# Patient Record
Sex: Male | Born: 1945
Health system: Southern US, Community
[De-identification: ages and names within clinical notes are randomized; demographics above are authoritative.]

## PROBLEM LIST (undated history)

## (undated) DIAGNOSIS — H409 Unspecified glaucoma: Secondary | ICD-10-CM

## (undated) DIAGNOSIS — I639 Cerebral infarction, unspecified: Secondary | ICD-10-CM

## (undated) DIAGNOSIS — R7303 Prediabetes: Secondary | ICD-10-CM

## (undated) HISTORY — PX: EYE SURGERY: SHX253

---

## 1998-03-04 ENCOUNTER — Ambulatory Visit (HOSPITAL_COMMUNITY): Admission: RE | Admit: 1998-03-04 | Discharge: 1998-03-04 | Payer: Self-pay | Admitting: Cardiology

## 2001-03-22 ENCOUNTER — Encounter: Payer: Self-pay | Admitting: Gastroenterology

## 2001-03-22 ENCOUNTER — Ambulatory Visit (HOSPITAL_COMMUNITY): Admission: RE | Admit: 2001-03-22 | Discharge: 2001-03-22 | Payer: Self-pay | Admitting: Gastroenterology

## 2001-03-25 ENCOUNTER — Ambulatory Visit (HOSPITAL_COMMUNITY): Admission: RE | Admit: 2001-03-25 | Discharge: 2001-03-25 | Payer: Self-pay | Admitting: Gastroenterology

## 2001-03-27 ENCOUNTER — Encounter: Payer: Self-pay | Admitting: Gastroenterology

## 2001-03-27 ENCOUNTER — Ambulatory Visit (HOSPITAL_COMMUNITY): Admission: RE | Admit: 2001-03-27 | Discharge: 2001-03-27 | Payer: Self-pay | Admitting: Gastroenterology

## 2009-11-10 ENCOUNTER — Ambulatory Visit: Payer: Self-pay | Admitting: Otolaryngology

## 2010-10-28 ENCOUNTER — Ambulatory Visit: Payer: Self-pay | Admitting: Internal Medicine

## 2010-11-28 ENCOUNTER — Ambulatory Visit: Payer: Self-pay | Admitting: Endocrinology

## 2011-01-24 ENCOUNTER — Ambulatory Visit: Payer: Self-pay | Admitting: Internal Medicine

## 2011-02-06 ENCOUNTER — Ambulatory Visit: Payer: Self-pay | Admitting: Gastroenterology

## 2011-02-08 LAB — PATHOLOGY REPORT

## 2011-08-14 ENCOUNTER — Emergency Department: Payer: Self-pay | Admitting: *Deleted

## 2011-09-21 ENCOUNTER — Ambulatory Visit: Payer: Self-pay | Admitting: Urology

## 2011-09-21 ENCOUNTER — Emergency Department: Payer: Self-pay | Admitting: Unknown Physician Specialty

## 2011-09-21 LAB — URINALYSIS, COMPLETE
Bilirubin,UR: NEGATIVE
Blood: NEGATIVE
Glucose,UR: 150 mg/dL (ref 0–75)
Leukocyte Esterase: NEGATIVE
Nitrite: NEGATIVE
Ph: 8 (ref 4.5–8.0)
Protein: NEGATIVE
RBC,UR: 3 /HPF (ref 0–5)
Specific Gravity: 1.014 (ref 1.003–1.030)
Squamous Epithelial: NONE SEEN
WBC UR: NONE SEEN /HPF (ref 0–5)

## 2011-09-21 LAB — COMPREHENSIVE METABOLIC PANEL
Albumin: 4.5 g/dL (ref 3.4–5.0)
Alkaline Phosphatase: 55 U/L (ref 50–136)
Anion Gap: 15 (ref 7–16)
BUN: 18 mg/dL (ref 7–18)
Bilirubin,Total: 0.5 mg/dL (ref 0.2–1.0)
Calcium, Total: 9.4 mg/dL (ref 8.5–10.1)
Chloride: 102 mmol/L (ref 98–107)
Co2: 20 mmol/L — ABNORMAL LOW (ref 21–32)
Creatinine: 1.32 mg/dL — ABNORMAL HIGH (ref 0.60–1.30)
EGFR (African American): >60
EGFR (Non-African Amer.): 58 — ABNORMAL LOW
Glucose: 143 mg/dL — ABNORMAL HIGH (ref 65–99)
Osmolality: 278 (ref 275–301)
Potassium: 3.6 mmol/L (ref 3.5–5.1)
SGOT(AST): 31 U/L (ref 15–37)
SGPT (ALT): 32 U/L
Sodium: 137 mmol/L (ref 136–145)
Total Protein: 7.9 g/dL (ref 6.4–8.2)

## 2011-09-21 LAB — CBC
HCT: 43 % (ref 40.0–52.0)
HGB: 14.8 g/dL (ref 13.0–18.0)
MCH: 33.9 pg (ref 26.0–34.0)
MCHC: 34.5 g/dL (ref 32.0–36.0)
MCV: 99 fL (ref 80–100)
Platelet: 171 10*3/uL (ref 150–440)
RBC: 4.36 10*6/uL — ABNORMAL LOW (ref 4.40–5.90)
RDW: 12.8 % (ref 11.5–14.5)
WBC: 10.6 10*3/uL (ref 3.8–10.6)

## 2011-09-21 LAB — LIPASE, BLOOD: Lipase: 69 U/L — ABNORMAL LOW (ref 73–393)

## 2013-08-15 ENCOUNTER — Ambulatory Visit: Payer: Self-pay | Admitting: Family Medicine

## 2013-08-15 LAB — URINALYSIS, COMPLETE
Bilirubin,UR: NEGATIVE
Glucose,UR: 250 mg/dL (ref 0–75)
Ketone: NEGATIVE
Leukocyte Esterase: NEGATIVE
Nitrite: NEGATIVE
Ph: 6 (ref 4.5–8.0)
Protein: NEGATIVE
Specific Gravity: 1.02 (ref 1.003–1.030)

## 2013-10-08 ENCOUNTER — Ambulatory Visit: Payer: Self-pay | Admitting: Physician Assistant

## 2013-10-08 LAB — COMPREHENSIVE METABOLIC PANEL
ALK PHOS: 80 U/L
ALT: 36 U/L (ref 12–78)
ANION GAP: 10 (ref 7–16)
Albumin: 4.2 g/dL (ref 3.4–5.0)
BUN: 11 mg/dL (ref 7–18)
Bilirubin,Total: 0.3 mg/dL (ref 0.2–1.0)
CHLORIDE: 101 mmol/L (ref 98–107)
Calcium, Total: 9.5 mg/dL (ref 8.5–10.1)
Co2: 28 mmol/L (ref 21–32)
Creatinine: 0.89 mg/dL (ref 0.60–1.30)
Glucose: 99 mg/dL (ref 65–99)
Osmolality: 277 (ref 275–301)
POTASSIUM: 4.1 mmol/L (ref 3.5–5.1)
SGOT(AST): 22 U/L (ref 15–37)
Sodium: 139 mmol/L (ref 136–145)
Total Protein: 8 g/dL (ref 6.4–8.2)

## 2013-10-08 LAB — CBC WITH DIFFERENTIAL/PLATELET
BASOS ABS: 0.1 10*3/uL (ref 0.0–0.1)
BASOS PCT: 1.2 %
Eosinophil #: 0.2 10*3/uL (ref 0.0–0.7)
Eosinophil %: 3.1 %
HCT: 44 % (ref 40.0–52.0)
HGB: 15.1 g/dL (ref 13.0–18.0)
LYMPHS ABS: 2.7 10*3/uL (ref 1.0–3.6)
LYMPHS PCT: 45.2 %
MCH: 33.2 pg (ref 26.0–34.0)
MCHC: 34.3 g/dL (ref 32.0–36.0)
MCV: 97 fL (ref 80–100)
Monocyte #: 0.5 x10 3/mm (ref 0.2–1.0)
Monocyte %: 8.9 %
NEUTROS ABS: 2.5 10*3/uL (ref 1.4–6.5)
NEUTROS PCT: 41.6 %
Platelet: 194 10*3/uL (ref 150–440)
RBC: 4.54 10*6/uL (ref 4.40–5.90)
RDW: 12.9 % (ref 11.5–14.5)
WBC: 6 10*3/uL (ref 3.8–10.6)

## 2013-10-08 LAB — LIPASE, BLOOD: LIPASE: 112 U/L (ref 73–393)

## 2013-10-08 LAB — AMYLASE: Amylase: 42 U/L (ref 25–115)

## 2013-10-15 ENCOUNTER — Encounter: Payer: Self-pay | Admitting: Physician Assistant

## 2013-11-02 ENCOUNTER — Encounter: Payer: Self-pay | Admitting: Physician Assistant

## 2013-12-03 ENCOUNTER — Encounter: Payer: Self-pay | Admitting: Physician Assistant

## 2015-11-15 ENCOUNTER — Ambulatory Visit
Admission: EM | Admit: 2015-11-15 | Discharge: 2015-11-15 | Disposition: A | Discharge status: Home / Self Care | Payer: PPO | Attending: Family Medicine | Admitting: Family Medicine

## 2015-11-15 DIAGNOSIS — J069 Acute upper respiratory infection, unspecified: Secondary | ICD-10-CM

## 2015-11-15 HISTORY — DX: Unspecified glaucoma: H40.9

## 2015-11-15 MED ORDER — GUAIFENESIN-CODEINE 100-10 MG/5ML PO SOLN: 5.0000 mL | Freq: Every evening | ORAL | Status: DC | PRN

## 2015-11-15 MED ORDER — BENZONATATE 100 MG PO CAPS: 100.0000 mg | ORAL_CAPSULE | Freq: Three times a day (TID) | ORAL | Status: DC | PRN

## 2015-11-15 NOTE — ED Notes (Signed)
C/o post nasal drip x 2-3 days. States has dry hacky cough.

## 2015-11-15 NOTE — ED Provider Notes (Signed)
Daniel Manning  ____________________________________________  Time seen: Approximately 5:52 PM  I have reviewed the triage vital signs and the nursing notes.   HISTORY  Chief Complaint URI and Sore Throat   HPI Daniel Manning is a 70 y.o. male presents with complaints of 2-3 days of runny nose, nasal congestion, watery eyes, sneezing and dry cough. Patient reports that the dry cough does occasionally wake him up from sleep. Denies fevers. Reports continues to eat and drink well. States overall is feeling well. Reports has taken some over-the-counter cough and congestion medications without symptom resolution.  Denies known sick contacts. Denies chest pain, shortness of breath, dizziness, weakness, vision changes, neck pain or back pain.  PCP: Daniel Manning    Past Medical History  Diagnosis Date  . Glaucoma     There are no active problems to display for this patient.   History reviewed. No pertinent past surgical history.  Current Outpatient Rx  Name  Route  Sig  Dispense  Refill  . valACYclovir (VALTREX) 500 MG tablet   Oral   Take 500 mg by mouth daily.         .           .             Allergies Review of patient's allergies indicates no known allergies.  Family History  Problem Relation Age of Onset  . Cancer Mother   . Heart attack Father     Social History Social History  Substance Use Topics  . Smoking status: Former Research scientist (life sciences)  . Smokeless tobacco: None  . Alcohol Use: No    Review of Systems Constitutional: No fever/chills Eyes: No visual changes. ENT: States occasional sore throat. Positive runny nose, nasal congestion, sneezing. Cardiovascular: Denies chest pain. Respiratory: Denies shortness of breath. Gastrointestinal: No abdominal pain.  No nausea, no vomiting.  No diarrhea.  No constipation. Genitourinary: Negative for dysuria. Musculoskeletal: Negative for back pain. Skin: Negative for rash. Neurological: Negative for headaches,  focal weakness or numbness.  10-point ROS otherwise negative.  ____________________________________________   PHYSICAL EXAM:  VITAL SIGNS: ED Triage Vitals  Enc Vitals Group     BP 11/15/15 1608 141/81 mmHg     Pulse Rate 11/15/15 1608 74     Resp 11/15/15 1608 16     Temp 11/15/15 1608 98.2 F (36.8 C)     Temp Source 11/15/15 1608 Tympanic     SpO2 11/15/15 1608 98 %     Weight 11/15/15 1608 186 lb (84.369 kg)     Height 11/15/15 1608 5\' 7"  (1.702 m)     Head Cir --      Peak Flow --      Pain Score --      Pain Loc --      Pain Edu? --      Excl. in [place]? --   Constitutional: Alert and oriented. Well appearing and in no acute distress. Eyes: Conjunctivae are normal. PERRL. EOMI. Head: Atraumatic. No sinus tenderness to palpation. No swelling. No erythema.  Ears: no erythema, normal TMs bilaterally.   Nose:Nasal congestion with clear rhinorrhea  Mouth/Throat: Mucous membranes are moist. No pharyngeal erythema. No tonsillar swelling or exudate.  Neck: No stridor.  No cervical spine tenderness to palpation. Hematological/Lymphatic/Immunilogical: No cervical lymphadenopathy. Cardiovascular: Normal rate, regular rhythm. Grossly normal heart sounds.  Good peripheral circulation. Respiratory: Normal respiratory effort.  No retractions. Lungs CTAB.No wheezes, rales or rhonchi. Good air movement.  Gastrointestinal: Soft and  nontender. Normal Bowel sounds. No CVA tenderness. Musculoskeletal: No lower or upper extremity tenderness nor edema. No cervical, thoracic or lumbar tenderness to palpation. Neurologic:  Normal speech and language. No gross focal neurologic deficits are appreciated. No gait instability. Skin:  Skin is warm, dry and intact. No rash noted. Psychiatric: Mood and affect are normal. Speech and behavior are normal.  ____________________________________________   LABS (all labs ordered are listed, but only abnormal results are displayed)  Labs Reviewed - No data  to display  INITIAL IMPRESSION / ASSESSMENT AND PLAN / ED COURSE  Pertinent labs & imaging results that were available during my Manning of the patient were reviewed by me and considered in my medical decision making (see chart for details).  Very well-appearing patient. No acute distress. Presents for the complaints of 2-3 days of runny nose, nasal congestion, sneezing, watery eyes. Denies fevers. Reports continues to eat and drink well. States he does not think it is strep throat does not want to be stopped for strep throat. Suspect viral upper respiratory infection. Discussed will treat supportively and symptomatically. Will treat with when necessary Tessalon Perles during the day as well as when necessary guaifenesin with Mucinex at night. Patient reports that he is taking the guaifenesin with Mucinex at night and tolerated well in past.Discussed indication, risks and benefits of medications with patient. Encouraged rest, fluids and PCP follow up.  Discussed follow up with Primary Manning physician this week. Discussed follow up and return parameters including no resolution or any worsening concerns. Patient verbalized understanding and agreed to plan.   ____________________________________________   FINAL CLINICAL IMPRESSION(S) / ED DIAGNOSES  Final diagnoses:  Upper respiratory infection      Note: This dictation was prepared with Dragon dictation along with smaller phrase technology. Any transcriptional errors that result from this process are unintentional.    Daniel Land, NP 11/15/15 1757

## 2015-11-15 NOTE — Discharge Instructions (Signed)
Take medication as prescribed. Rest. Drink plenty of fluids.   Follow up with your primary care physician this week as needed. Return to Urgent care for new or worsening concerns.    Upper Respiratory Infection, Adult Most upper respiratory infections (URIs) are a viral infection of the air passages leading to the lungs. A URI affects the nose, throat, and upper air passages. The most common type of URI is nasopharyngitis and is typically referred to as "the common cold." URIs run their course and usually go away on their own. Most of the time, a URI does not require medical attention, but sometimes a bacterial infection in the upper airways can follow a viral infection. This is called a secondary infection. Sinus and middle ear infections are common types of secondary upper respiratory infections. Bacterial pneumonia can also complicate a URI. A URI can worsen asthma and chronic obstructive pulmonary disease (COPD). Sometimes, these complications can require emergency medical care and may be life threatening.  CAUSES Almost all URIs are caused by viruses. A virus is a type of germ and can spread from one person to another.  RISKS FACTORS You may be at risk for a URI if:   You smoke.   You have chronic heart or lung disease.  You have a weakened defense (immune) system.   You are very young or very old.   You have nasal allergies or asthma.  You work in crowded or poorly ventilated areas.  You work in health care facilities or schools. SIGNS AND SYMPTOMS  Symptoms typically develop 2-3 days after you come in contact with a cold virus. Most viral URIs last 7-10 days. However, viral URIs from the influenza virus (flu virus) can last 14-18 days and are typically more severe. Symptoms may include:   Runny or stuffy (congested) nose.   Sneezing.   Cough.   Sore throat.   Headache.   Fatigue.   Fever.   Loss of appetite.   Pain in your forehead, behind your eyes,  and over your cheekbones (sinus pain).  Muscle aches.  DIAGNOSIS  Your health care provider may diagnose a URI by:  Physical exam.  Tests to check that your symptoms are not due to another condition such as:  Strep throat.  Sinusitis.  Pneumonia.  Asthma. TREATMENT  A URI goes away on its own with time. It cannot be cured with medicines, but medicines may be prescribed or recommended to relieve symptoms. Medicines may help:  Reduce your fever.  Reduce your cough.  Relieve nasal congestion. HOME CARE INSTRUCTIONS   Take medicines only as directed by your health care provider.   Gargle warm saltwater or take cough drops to comfort your throat as directed by your health care provider.  Use a warm mist humidifier or inhale steam from a shower to increase air moisture. This may make it easier to breathe.  Drink enough fluid to keep your urine clear or pale yellow.   Eat soups and other clear broths and maintain good nutrition.   Rest as needed.   Return to work when your temperature has returned to normal or as your health care provider advises. You may need to stay home longer to avoid infecting others. You can also use a face mask and careful hand washing to prevent spread of the virus.  Increase the usage of your inhaler if you have asthma.   Do not use any tobacco products, including cigarettes, chewing tobacco, or electronic cigarettes. If you need help  quitting, ask your health care provider. PREVENTION  The best way to protect yourself from getting a cold is to practice good hygiene.   Avoid oral or hand contact with people with cold symptoms.   Wash your hands often if contact occurs.  There is no clear evidence that vitamin C, vitamin E, echinacea, or exercise reduces the chance of developing a cold. However, it is always recommended to get plenty of rest, exercise, and practice good nutrition.  SEEK MEDICAL CARE IF:   You are getting worse rather than  better.   Your symptoms are not controlled by medicine.   You have chills.  You have worsening shortness of breath.  You have brown or red mucus.  You have yellow or brown nasal discharge.  You have pain in your face, especially when you bend forward.  You have a fever.  You have swollen neck glands.  You have pain while swallowing.  You have white areas in the back of your throat. SEEK IMMEDIATE MEDICAL CARE IF:   You have severe or persistent:  Headache.  Ear pain.  Sinus pain.  Chest pain.  You have chronic lung disease and any of the following:  Wheezing.  Prolonged cough.  Coughing up blood.  A change in your usual mucus.  You have a stiff neck.  You have changes in your:  Vision.  Hearing.  Thinking.  Mood. MAKE SURE YOU:   Understand these instructions.  Will watch your condition.  Will get help right away if you are not doing well or get worse.   This information is not intended to replace advice given to you by your health care provider. Make sure you discuss any questions you have with your health care provider.   Document Released: 02/14/2001 Document Revised: 01/05/2015 Document Reviewed: 11/26/2013 Elsevier Interactive Patient Education [employer].

## 2015-12-13 ENCOUNTER — Ambulatory Visit
Admission: EM | Admit: 2015-12-13 | Discharge: 2015-12-13 | Disposition: A | Discharge status: Home / Self Care | Payer: PPO | Attending: Family Medicine | Admitting: Family Medicine

## 2015-12-13 ENCOUNTER — Encounter: Payer: Self-pay | Admitting: Emergency Medicine

## 2015-12-13 DIAGNOSIS — J101 Influenza due to other identified influenza virus with other respiratory manifestations: Secondary | ICD-10-CM | POA: Diagnosis not present

## 2015-12-13 LAB — RAPID INFLUENZA A&B ANTIGENS (ARMC ONLY): INFLUENZA A (ARMC): NEGATIVE

## 2015-12-13 LAB — RAPID INFLUENZA A&B ANTIGENS: Influenza B (ARMC): POSITIVE — AB

## 2015-12-13 MED ORDER — HYDROCOD POLST-CPM POLST ER 10-8 MG/5ML PO SUER: 5.0000 mL | Freq: Two times a day (BID) | ORAL | Status: DC | PRN

## 2015-12-13 MED ORDER — OSELTAMIVIR PHOSPHATE 75 MG PO CAPS: 75.0000 mg | ORAL_CAPSULE | Freq: Two times a day (BID) | ORAL | Status: DC

## 2015-12-13 NOTE — Discharge Instructions (Signed)
Influenza, Adult Influenza (flu) is an infection in the mouth, nose, and throat (respiratory tract) caused by a virus. The flu can make you feel very ill. Influenza spreads easily from person to person (contagious).  HOME CARE   Only take medicines as told by your doctor.  Use a cool mist humidifier to make breathing easier.  Get plenty of rest until your fever goes away. This usually takes 3 to 4 days.  Drink enough fluids to keep your pee (urine) clear or pale yellow.  Cover your mouth and nose when you cough or sneeze.  Wash your hands well to avoid spreading the flu.  Stay home from work or school until your fever has been gone for at least 1 full day.  Get a flu shot every year. GET HELP RIGHT AWAY IF:   You have trouble breathing or feel short of breath.  Your skin or nails turn blue.  You have severe neck pain or stiffness.  You have a severe headache, facial pain, or earache.  Your fever gets worse or keeps coming back.  You feel sick to your stomach (nauseous), throw up (vomit), or have watery poop (diarrhea).  You have chest pain.  You have a deep cough that gets worse, or you cough up more thick spit (mucus). MAKE SURE YOU:   Understand these instructions.  Will watch your condition.  Will get help right away if you are not doing well or get worse.   This information is not intended to replace advice given to you by your health care provider. Make sure you discuss any questions you have with your health care provider.   Document Released: 05/30/2008 Document Revised: 09/11/2014 Document Reviewed: 11/20/2011 Elsevier Interactive Patient Education 2016 Else[employer]  Upper Respiratory Infection, Adult Most upper respiratory infections (URIs) are caused by a virus. A URI affects the nose, throat, and upper air passages. The most common type of URI is often called "the common cold." HOME CARE   Take medicines only as told by your doctor.  Gargle warm  saltwater or take cough drops to comfort your throat as told by your doctor.  Use a warm mist humidifier or inhale steam from a shower to increase air moisture. This may make it easier to breathe.  Drink enough fluid to keep your pee (urine) clear or pale yellow.  Eat soups and other clear broths.  Have a healthy diet.  Rest as needed.  Go back to work when your fever is gone or your doctor says it is okay.  You may need to stay home longer to avoid giving your URI to others.  You can also wear a face mask and wash your hands often to prevent spread of the virus.  Use your inhaler more if you have asthma.  Do not use any tobacco products, including cigarettes, chewing tobacco, or electronic cigarettes. If you need help quitting, ask your doctor. GET HELP IF:  You are getting worse, not better.  Your symptoms are not helped by medicine.  You have chills.  You are getting more short of breath.  You have brown or red mucus.  You have yellow or brown discharge from your nose.  You have pain in your face, especially when you bend forward.  You have a fever.  You have puffy (swollen) neck glands.  You have pain while swallowing.  You have white areas in the back of your throat. GET HELP RIGHT AWAY IF:   You have very bad  or constant:  Headache.  Ear pain.  Pain in your forehead, behind your eyes, and over your cheekbones (sinus pain).  Chest pain.  You have long-lasting (chronic) lung disease and any of the following:  Wheezing.  Long-lasting cough.  Coughing up blood.  A change in your usual mucus.  You have a stiff neck.  You have changes in your:  Vision.  Hearing.  Thinking.  Mood. MAKE SURE YOU:   Understand these instructions.  Will watch your condition.  Will get help right away if you are not doing well or get worse.   This information is not intended to replace advice given to you by your health care provider. Make sure you  discuss any questions you have with your health care provider.   Document Released: 02/07/2008 Document Revised: 01/05/2015 Document Reviewed: 11/26/2013 Elsevier Interactive Patient Education [employer].

## 2015-12-13 NOTE — ED Notes (Signed)
Patient c/o cough, chest congestion, runny nose, and fever for the past 2 days.

## 2015-12-13 NOTE — ED Provider Notes (Signed)
CSN: [id number]     Arrival date & time 12/13/15  1628 History   First MD Initiated Contact with Patient 12/13/15 1725    Nurses notes were reviewed.  Chief Complaint  Patient presents with  . Cough  . Nasal Congestion   Patient reports being still initially sick or not there will Thursday they really hit him Friday acid daily he really start aching all over nasal congestion and coughing.. So for about some 2 hours he's felt miserable. He was sick about a month ago coughed up some green sputum states that the get better this from his sputum is not as bad but he's been aching all over well.  He has history of glaucoma multiple eye disease chronic corneal transplant as well. His mother has had cancer his father's had heart attack. He is a former smoker. He does not get his flu shot because he states it made him sick last with a making this 73 says yes.  (Consider location/radiation/quality/duration/timing/severity/associated sxs/prior Treatment) Patient is a 70 y.o. male presenting with cough. The history is provided by the patient.  Cough Cough characteristics:  Non-productive and hacking Severity:  Moderate Duration:  4 days Timing:  Constant Progression:  Waxing and waning Context: smoke exposure and upper respiratory infection   Relieved by:  Nothing Worsened by:  Nothing tried Associated symptoms: chills, fever, headaches, myalgias, rhinorrhea, sinus congestion and sore throat     Past Medical History  Diagnosis Date  . Glaucoma    History reviewed. No pertinent past surgical history. Family History  Problem Relation Age of Onset  . Cancer Mother   . Heart attack Father    Social History  Substance Use Topics  . Smoking status: Former Research scientist (life sciences)  . Smokeless tobacco: None  . Alcohol Use: No    Review of Systems  Constitutional: Positive for fever and chills.  HENT: Positive for rhinorrhea and sore throat.   Respiratory: Positive for cough.   Musculoskeletal: Positive for  myalgias.  Neurological: Positive for headaches.  All other systems reviewed and are negative.   Allergies  Combigan; Lumigan; Moxeza; Pravastatin; and [place]yman Medications   Prior to Admission medications   Medication Sig Start Date End Date Taking? Authorizing Provider  ALPRAZolam Duanne Moron) 0.5 MG tablet Take 0.5 mg by mouth at bedtime as needed for anxiety.   Yes Historical Provider, MD  brimonidine (ALPHAGAN) 0.2 % ophthalmic solution Place 1 drop into the left eye 2 (two) times daily.   Yes Historical Provider, MD  Carboxymeth-Glycerin-Polysorb 0.5-1-0.5 % SOLN Apply 1 drop to eye as needed.   Yes Historical Provider, MD  Multiple Vitamin (MULTIVITAMIN) tablet Take 1 tablet by mouth daily.   Yes Historical Provider, MD  pantoprazole (PROTONIX) 40 MG tablet Take 40 mg by mouth 2 (two) times daily.   Yes Historical Provider, MD  tamsulosin (FLOMAX) 0.4 MG CAPS capsule Take 0.4 mg by mouth daily.   Yes Historical Provider, MD  timolol (BETIMOL) 0.5 % ophthalmic solution Place 1 drop into the left eye 2 (two) times daily.   Yes Historical Provider, MD  traMADol (ULTRAM) 50 MG tablet Take 50 mg by mouth every 6 (six) hours as needed.   Yes Historical Provider, MD  benzonatate (TESSALON PERLES) 100 MG capsule Take 1 capsule (100 mg total) by mouth 3 (three) times daily as needed for cough. 11/15/15   [name], NP  chlorpheniramine-HYDROcodone [facility] PENNKINETIC ER) 10-8 MG/5ML SUER Take 5 mLs by mouth every 12 (twelve) hours as  needed for cough. 12/13/15   Frederich Cha, MD  guaiFENesin-codeine 100-10 MG/5ML syrup Take 5 mLs by mouth at bedtime as needed for cough (do not drive or operate machinery while taking as can cause drowsiness). 11/15/15   [name], NP  oseltamivir (TAMIFLU) 75 MG capsule Take 1 capsule (75 mg total) by mouth 2 (two) times daily. 12/13/15   Frederich Cha, MD  valACYclovir (VALTREX) 500 MG tablet Take 500 mg by mouth 2 (two) times daily.     Historical  Provider, MD   Meds Ordered and Administered this Visit  Medications - No data to display  BP 126/73 mmHg  Pulse 99  Temp(Src) 98.3 F (36.8 C) (Tympanic)  Resp 16  Ht 5\' 7"  (1.702 m)  Wt 186 lb (84.369 kg)  BMI 29.12 kg/m2  SpO2 95% No data found.   Physical Exam  Constitutional: He appears well-developed and well-nourished.  HENT:  Head: Normocephalic.  Eyes: Pupils are equal, round, and reactive to light.  Neck: Normal range of motion.  Cardiovascular: Normal rate and regular rhythm.   Pulmonary/Chest: Effort normal and breath sounds normal.  Musculoskeletal: Normal range of motion.  Neurological: He is alert.  Skin: Skin is warm and dry.  Vitals reviewed.   ED Course  Procedures (including critical care time)  Labs Review Labs Reviewed  RAPID INFLUENZA A&B ANTIGENS ([place]) - Abnormal; Notable for the following:    Influenza B ([facility]) POSITIVE (*)    All other components within normal limits    Imaging Review No results found.   Visual Acuity Review  Right Eye Distance:   Left Eye Distance:   Bilateral Distance:    Right Eye Near:   Left Eye Near:    Bilateral Near:      Results for orders placed or performed during the hospital encounter of 12/13/15  Rapid Influenza A&B Antigens ([facility] only)  Result Value Ref Range   Influenza A ([facility]) NEGATIVE NEGATIVE   Influenza B ([facility]) POSITIVE (A) NEGATIVE     MDM   1. Influenza B    We'll place patient on Tamiflu and Tussionex 1 teaspoon twice a day. Spray to him that the Tamiflu even better if he started it on Saturday or Sunday but because he still having symptoms and his history of having multiple medical problems we'll go ahead and treat him with the Tamiflu today. For the cough or placement test next 1 teaspoon twice a day. Since he has a history of multiple eye issues and cannot tolerate things advises I'll will hold off her antihistamine decongestants and also on anti-inflammatories. Follow-up  with his PCP in a week if not better.   Note: This dictation was prepared with Dragon dictation along with smaller phrase technology. Any transcriptional errors that result from this process are unintentional.    Frederich Cha, MD 12/13/15 [phone]

## 2015-12-23 DIAGNOSIS — Z947 Corneal transplant status: Secondary | ICD-10-CM | POA: Diagnosis not present

## 2016-01-17 DIAGNOSIS — M545 Low back pain: Secondary | ICD-10-CM | POA: Diagnosis not present

## 2016-01-17 DIAGNOSIS — G8929 Other chronic pain: Secondary | ICD-10-CM | POA: Diagnosis not present

## 2016-01-17 DIAGNOSIS — M79601 Pain in right arm: Secondary | ICD-10-CM | POA: Diagnosis not present

## 2016-01-17 DIAGNOSIS — M79621 Pain in right upper arm: Secondary | ICD-10-CM | POA: Diagnosis not present

## 2016-02-14 DIAGNOSIS — H209 Unspecified iridocyclitis: Secondary | ICD-10-CM | POA: Diagnosis not present

## 2016-02-14 DIAGNOSIS — H4042X1 Glaucoma secondary to eye inflammation, left eye, mild stage: Secondary | ICD-10-CM | POA: Diagnosis not present

## 2016-02-28 DIAGNOSIS — K219 Gastro-esophageal reflux disease without esophagitis: Secondary | ICD-10-CM | POA: Diagnosis not present

## 2016-02-28 DIAGNOSIS — G8929 Other chronic pain: Secondary | ICD-10-CM | POA: Diagnosis not present

## 2016-02-28 DIAGNOSIS — E119 Type 2 diabetes mellitus without complications: Secondary | ICD-10-CM | POA: Diagnosis not present

## 2016-02-28 DIAGNOSIS — E7800 Pure hypercholesterolemia, unspecified: Secondary | ICD-10-CM | POA: Diagnosis not present

## 2016-02-28 DIAGNOSIS — M545 Low back pain: Secondary | ICD-10-CM | POA: Diagnosis not present

## 2016-02-28 DIAGNOSIS — N401 Enlarged prostate with lower urinary tract symptoms: Secondary | ICD-10-CM | POA: Diagnosis not present

## 2016-05-15 DIAGNOSIS — H8110 Benign paroxysmal vertigo, unspecified ear: Secondary | ICD-10-CM | POA: Diagnosis not present

## 2016-09-11 DIAGNOSIS — E119 Type 2 diabetes mellitus without complications: Secondary | ICD-10-CM | POA: Diagnosis not present

## 2016-09-11 DIAGNOSIS — M545 Low back pain: Secondary | ICD-10-CM | POA: Diagnosis not present

## 2016-09-11 DIAGNOSIS — Z79891 Long term (current) use of opiate analgesic: Secondary | ICD-10-CM | POA: Diagnosis not present

## 2016-09-11 DIAGNOSIS — E7800 Pure hypercholesterolemia, unspecified: Secondary | ICD-10-CM | POA: Diagnosis not present

## 2016-09-11 DIAGNOSIS — G8929 Other chronic pain: Secondary | ICD-10-CM | POA: Diagnosis not present

## 2016-09-11 DIAGNOSIS — K219 Gastro-esophageal reflux disease without esophagitis: Secondary | ICD-10-CM | POA: Diagnosis not present

## 2016-09-11 DIAGNOSIS — N401 Enlarged prostate with lower urinary tract symptoms: Secondary | ICD-10-CM | POA: Diagnosis not present

## 2016-10-16 DIAGNOSIS — H209 Unspecified iridocyclitis: Secondary | ICD-10-CM | POA: Diagnosis not present

## 2016-10-16 DIAGNOSIS — H4042X1 Glaucoma secondary to eye inflammation, left eye, mild stage: Secondary | ICD-10-CM | POA: Diagnosis not present

## 2016-10-16 DIAGNOSIS — Z947 Corneal transplant status: Secondary | ICD-10-CM | POA: Diagnosis not present

## 2016-11-02 DIAGNOSIS — H2511 Age-related nuclear cataract, right eye: Secondary | ICD-10-CM | POA: Diagnosis not present

## 2016-11-02 DIAGNOSIS — Z947 Corneal transplant status: Secondary | ICD-10-CM | POA: Diagnosis not present

## 2017-02-12 DIAGNOSIS — N401 Enlarged prostate with lower urinary tract symptoms: Secondary | ICD-10-CM | POA: Diagnosis not present

## 2017-02-12 DIAGNOSIS — E7800 Pure hypercholesterolemia, unspecified: Secondary | ICD-10-CM | POA: Diagnosis not present

## 2017-02-12 DIAGNOSIS — Z125 Encounter for screening for malignant neoplasm of prostate: Secondary | ICD-10-CM | POA: Diagnosis not present

## 2017-02-12 DIAGNOSIS — E119 Type 2 diabetes mellitus without complications: Secondary | ICD-10-CM | POA: Diagnosis not present

## 2017-02-12 DIAGNOSIS — K219 Gastro-esophageal reflux disease without esophagitis: Secondary | ICD-10-CM | POA: Diagnosis not present

## 2017-02-12 DIAGNOSIS — M545 Low back pain: Secondary | ICD-10-CM | POA: Diagnosis not present

## 2017-02-12 DIAGNOSIS — G8929 Other chronic pain: Secondary | ICD-10-CM | POA: Diagnosis not present

## 2017-02-19 DIAGNOSIS — Z125 Encounter for screening for malignant neoplasm of prostate: Secondary | ICD-10-CM | POA: Diagnosis not present

## 2017-02-19 DIAGNOSIS — E7800 Pure hypercholesterolemia, unspecified: Secondary | ICD-10-CM | POA: Diagnosis not present

## 2017-02-19 DIAGNOSIS — E119 Type 2 diabetes mellitus without complications: Secondary | ICD-10-CM | POA: Diagnosis not present

## 2017-03-12 DIAGNOSIS — H4010X1 Unspecified open-angle glaucoma, mild stage: Secondary | ICD-10-CM | POA: Diagnosis not present

## 2017-04-23 DIAGNOSIS — H209 Unspecified iridocyclitis: Secondary | ICD-10-CM | POA: Diagnosis not present

## 2017-04-23 DIAGNOSIS — H4042X1 Glaucoma secondary to eye inflammation, left eye, mild stage: Secondary | ICD-10-CM | POA: Diagnosis not present

## 2017-04-30 DIAGNOSIS — R0683 Snoring: Secondary | ICD-10-CM | POA: Diagnosis not present

## 2017-05-28 DIAGNOSIS — G4733 Obstructive sleep apnea (adult) (pediatric): Secondary | ICD-10-CM | POA: Diagnosis not present

## 2017-07-12 DIAGNOSIS — G4733 Obstructive sleep apnea (adult) (pediatric): Secondary | ICD-10-CM | POA: Diagnosis not present

## 2017-08-11 DIAGNOSIS — G4733 Obstructive sleep apnea (adult) (pediatric): Secondary | ICD-10-CM | POA: Diagnosis not present

## 2017-08-17 DIAGNOSIS — E669 Obesity, unspecified: Secondary | ICD-10-CM | POA: Diagnosis not present

## 2017-08-17 DIAGNOSIS — R7303 Prediabetes: Secondary | ICD-10-CM | POA: Diagnosis not present

## 2017-08-17 DIAGNOSIS — G4733 Obstructive sleep apnea (adult) (pediatric): Secondary | ICD-10-CM | POA: Diagnosis not present

## 2017-08-17 DIAGNOSIS — K219 Gastro-esophageal reflux disease without esophagitis: Secondary | ICD-10-CM | POA: Diagnosis not present

## 2017-08-17 DIAGNOSIS — H409 Unspecified glaucoma: Secondary | ICD-10-CM | POA: Diagnosis not present

## 2017-08-20 DIAGNOSIS — N401 Enlarged prostate with lower urinary tract symptoms: Secondary | ICD-10-CM | POA: Diagnosis not present

## 2017-08-20 DIAGNOSIS — K219 Gastro-esophageal reflux disease without esophagitis: Secondary | ICD-10-CM | POA: Diagnosis not present

## 2017-08-20 DIAGNOSIS — Z23 Encounter for immunization: Secondary | ICD-10-CM | POA: Diagnosis not present

## 2017-08-20 DIAGNOSIS — M545 Low back pain: Secondary | ICD-10-CM | POA: Diagnosis not present

## 2017-08-20 DIAGNOSIS — G8929 Other chronic pain: Secondary | ICD-10-CM | POA: Diagnosis not present

## 2017-08-20 DIAGNOSIS — E119 Type 2 diabetes mellitus without complications: Secondary | ICD-10-CM | POA: Diagnosis not present

## 2017-08-20 DIAGNOSIS — E7800 Pure hypercholesterolemia, unspecified: Secondary | ICD-10-CM | POA: Diagnosis not present

## 2017-08-20 DIAGNOSIS — Z0000 Encounter for general adult medical examination without abnormal findings: Secondary | ICD-10-CM | POA: Diagnosis not present

## 2017-08-20 DIAGNOSIS — G4733 Obstructive sleep apnea (adult) (pediatric): Secondary | ICD-10-CM | POA: Diagnosis not present

## 2017-09-10 DIAGNOSIS — Z947 Corneal transplant status: Secondary | ICD-10-CM | POA: Diagnosis not present

## 2017-09-11 DIAGNOSIS — G4733 Obstructive sleep apnea (adult) (pediatric): Secondary | ICD-10-CM | POA: Diagnosis not present

## 2017-09-17 DIAGNOSIS — H4042X1 Glaucoma secondary to eye inflammation, left eye, mild stage: Secondary | ICD-10-CM | POA: Diagnosis not present

## 2017-09-17 DIAGNOSIS — H209 Unspecified iridocyclitis: Secondary | ICD-10-CM | POA: Diagnosis not present

## 2017-09-17 DIAGNOSIS — Z947 Corneal transplant status: Secondary | ICD-10-CM | POA: Diagnosis not present

## 2017-09-17 DIAGNOSIS — B0052 Herpesviral keratitis: Secondary | ICD-10-CM | POA: Diagnosis not present

## 2017-09-18 DIAGNOSIS — H4042X1 Glaucoma secondary to eye inflammation, left eye, mild stage: Secondary | ICD-10-CM | POA: Diagnosis not present

## 2017-09-18 DIAGNOSIS — H2511 Age-related nuclear cataract, right eye: Secondary | ICD-10-CM | POA: Diagnosis not present

## 2017-09-18 DIAGNOSIS — H209 Unspecified iridocyclitis: Secondary | ICD-10-CM | POA: Diagnosis not present

## 2017-09-24 DIAGNOSIS — Z947 Corneal transplant status: Secondary | ICD-10-CM | POA: Diagnosis not present

## 2017-09-27 DIAGNOSIS — Z947 Corneal transplant status: Secondary | ICD-10-CM | POA: Diagnosis not present

## 2017-09-27 DIAGNOSIS — B0052 Herpesviral keratitis: Secondary | ICD-10-CM | POA: Diagnosis not present

## 2017-10-01 DIAGNOSIS — Z947 Corneal transplant status: Secondary | ICD-10-CM | POA: Diagnosis not present

## 2017-10-08 DIAGNOSIS — Z947 Corneal transplant status: Secondary | ICD-10-CM | POA: Diagnosis not present

## 2017-10-10 DIAGNOSIS — Z947 Corneal transplant status: Secondary | ICD-10-CM | POA: Diagnosis not present

## 2017-10-10 DIAGNOSIS — T86840 Corneal transplant rejection: Secondary | ICD-10-CM | POA: Diagnosis not present

## 2017-10-12 DIAGNOSIS — G4733 Obstructive sleep apnea (adult) (pediatric): Secondary | ICD-10-CM | POA: Diagnosis not present

## 2017-10-15 DIAGNOSIS — G4733 Obstructive sleep apnea (adult) (pediatric): Secondary | ICD-10-CM | POA: Diagnosis not present

## 2017-10-22 DIAGNOSIS — Z947 Corneal transplant status: Secondary | ICD-10-CM | POA: Diagnosis not present

## 2017-11-05 DIAGNOSIS — Z947 Corneal transplant status: Secondary | ICD-10-CM | POA: Diagnosis not present

## 2017-11-07 DIAGNOSIS — T86841 Corneal transplant failure: Secondary | ICD-10-CM | POA: Diagnosis not present

## 2017-11-09 DIAGNOSIS — G4733 Obstructive sleep apnea (adult) (pediatric): Secondary | ICD-10-CM | POA: Diagnosis not present

## 2017-12-03 DIAGNOSIS — Z947 Corneal transplant status: Secondary | ICD-10-CM | POA: Diagnosis not present

## 2017-12-10 DIAGNOSIS — G4733 Obstructive sleep apnea (adult) (pediatric): Secondary | ICD-10-CM | POA: Diagnosis not present

## 2018-01-07 DIAGNOSIS — M19012 Primary osteoarthritis, left shoulder: Secondary | ICD-10-CM | POA: Diagnosis not present

## 2018-01-07 DIAGNOSIS — M25512 Pain in left shoulder: Secondary | ICD-10-CM | POA: Diagnosis not present

## 2018-01-07 DIAGNOSIS — Z947 Corneal transplant status: Secondary | ICD-10-CM | POA: Diagnosis not present

## 2018-01-09 DIAGNOSIS — G4733 Obstructive sleep apnea (adult) (pediatric): Secondary | ICD-10-CM | POA: Diagnosis not present

## 2018-02-06 DIAGNOSIS — G4733 Obstructive sleep apnea (adult) (pediatric): Secondary | ICD-10-CM | POA: Diagnosis not present

## 2018-02-09 DIAGNOSIS — G4733 Obstructive sleep apnea (adult) (pediatric): Secondary | ICD-10-CM | POA: Diagnosis not present

## 2018-02-11 DIAGNOSIS — X32XXXA Exposure to sunlight, initial encounter: Secondary | ICD-10-CM | POA: Diagnosis not present

## 2018-02-11 DIAGNOSIS — D2271 Melanocytic nevi of right lower limb, including hip: Secondary | ICD-10-CM | POA: Diagnosis not present

## 2018-02-11 DIAGNOSIS — D2262 Melanocytic nevi of left upper limb, including shoulder: Secondary | ICD-10-CM | POA: Diagnosis not present

## 2018-02-11 DIAGNOSIS — D2272 Melanocytic nevi of left lower limb, including hip: Secondary | ICD-10-CM | POA: Diagnosis not present

## 2018-02-11 DIAGNOSIS — L738 Other specified follicular disorders: Secondary | ICD-10-CM | POA: Diagnosis not present

## 2018-02-11 DIAGNOSIS — D225 Melanocytic nevi of trunk: Secondary | ICD-10-CM | POA: Diagnosis not present

## 2018-02-11 DIAGNOSIS — L570 Actinic keratosis: Secondary | ICD-10-CM | POA: Diagnosis not present

## 2018-02-11 DIAGNOSIS — L821 Other seborrheic keratosis: Secondary | ICD-10-CM | POA: Diagnosis not present

## 2018-02-11 DIAGNOSIS — D2261 Melanocytic nevi of right upper limb, including shoulder: Secondary | ICD-10-CM | POA: Diagnosis not present

## 2018-02-18 DIAGNOSIS — Z9049 Acquired absence of other specified parts of digestive tract: Secondary | ICD-10-CM | POA: Diagnosis not present

## 2018-02-18 DIAGNOSIS — Z947 Corneal transplant status: Secondary | ICD-10-CM | POA: Diagnosis not present

## 2018-02-18 DIAGNOSIS — E1140 Type 2 diabetes mellitus with diabetic neuropathy, unspecified: Secondary | ICD-10-CM | POA: Diagnosis not present

## 2018-02-18 DIAGNOSIS — T86841 Corneal transplant failure: Secondary | ICD-10-CM | POA: Diagnosis not present

## 2018-02-18 DIAGNOSIS — Z961 Presence of intraocular lens: Secondary | ICD-10-CM | POA: Diagnosis not present

## 2018-02-18 DIAGNOSIS — G8929 Other chronic pain: Secondary | ICD-10-CM | POA: Diagnosis not present

## 2018-02-18 DIAGNOSIS — E7800 Pure hypercholesterolemia, unspecified: Secondary | ICD-10-CM | POA: Diagnosis not present

## 2018-02-18 DIAGNOSIS — Z888 Allergy status to other drugs, medicaments and biological substances status: Secondary | ICD-10-CM | POA: Diagnosis not present

## 2018-02-18 DIAGNOSIS — E559 Vitamin D deficiency, unspecified: Secondary | ICD-10-CM | POA: Diagnosis not present

## 2018-02-18 DIAGNOSIS — H4089 Other specified glaucoma: Secondary | ICD-10-CM | POA: Diagnosis not present

## 2018-02-18 DIAGNOSIS — N400 Enlarged prostate without lower urinary tract symptoms: Secondary | ICD-10-CM | POA: Diagnosis not present

## 2018-02-18 DIAGNOSIS — Z87891 Personal history of nicotine dependence: Secondary | ICD-10-CM | POA: Diagnosis not present

## 2018-02-18 DIAGNOSIS — Z79899 Other long term (current) drug therapy: Secondary | ICD-10-CM | POA: Diagnosis not present

## 2018-02-18 DIAGNOSIS — H1820 Unspecified corneal edema: Secondary | ICD-10-CM | POA: Diagnosis not present

## 2018-02-18 DIAGNOSIS — H2010 Chronic iridocyclitis, unspecified eye: Secondary | ICD-10-CM | POA: Diagnosis not present

## 2018-02-18 DIAGNOSIS — G4730 Sleep apnea, unspecified: Secondary | ICD-10-CM | POA: Diagnosis not present

## 2018-02-22 DIAGNOSIS — H18232 Secondary corneal edema, left eye: Secondary | ICD-10-CM | POA: Diagnosis not present

## 2018-03-01 ENCOUNTER — Other Ambulatory Visit: Payer: Self-pay

## 2018-03-01 ENCOUNTER — Ambulatory Visit: Payer: PPO

## 2018-03-01 ENCOUNTER — Ambulatory Visit
Admission: EM | Admit: 2018-03-01 | Discharge: 2018-03-01 | Disposition: A | Discharge status: Home / Self Care | Payer: PPO | Attending: Family Medicine | Admitting: Family Medicine

## 2018-03-01 ENCOUNTER — Encounter: Payer: Self-pay | Admitting: Emergency Medicine

## 2018-03-01 DIAGNOSIS — S6010XA Contusion of unspecified finger with damage to nail, initial encounter: Secondary | ICD-10-CM

## 2018-03-01 DIAGNOSIS — W231XXA Caught, crushed, jammed, or pinched between stationary objects, initial encounter: Secondary | ICD-10-CM | POA: Diagnosis not present

## 2018-03-01 DIAGNOSIS — S67194A Crushing injury of right ring finger, initial encounter: Secondary | ICD-10-CM | POA: Diagnosis not present

## 2018-03-01 DIAGNOSIS — S60131A Contusion of right middle finger with damage to nail, initial encounter: Secondary | ICD-10-CM

## 2018-03-01 DIAGNOSIS — S67192A Crushing injury of right middle finger, initial encounter: Secondary | ICD-10-CM

## 2018-03-01 DIAGNOSIS — S6991XA Unspecified injury of right wrist, hand and finger(s), initial encounter: Secondary | ICD-10-CM | POA: Diagnosis not present

## 2018-03-01 DIAGNOSIS — S60141A Contusion of right ring finger with damage to nail, initial encounter: Secondary | ICD-10-CM

## 2018-03-01 MED ORDER — HYDROCODONE-ACETAMINOPHEN 5-325 MG PO TABS: 1.0000 | ORAL_TABLET | ORAL | 0 refills | Status: DC | PRN

## 2018-03-01 NOTE — ED Provider Notes (Signed)
[facility]    CSN: [id number] Arrival date & time: 03/01/18  1819     History   Chief Complaint Chief Complaint  Patient presents with  . Finger Injury    right 3rd and 4th    HPI Daniel Manning is a 72 y.o. male.  Presents to the urgent care facility for evaluation of crush injury to the right third and fourth digits.  Patient states he was closing a garage door mainly when his right third and fourth digit distal phalanxes were call in between the 2 panels of the garage door crushed.  Injury occurred 1030 this morning.  He continued to work throughout the day but noticed increasing pain and swelling underneath both nails of the right third and fourth digit.  Pain is currently 7 out of 10.  He took Tylenol at 230 with minimal relief.  He denies any numbness or tingling.  He is not on blood thinners.  He denies any other injury to his body.  HPI  Past Medical History:  Diagnosis Date  . Glaucoma     There are no active problems to display for this patient.   Past Surgical History:  Procedure Laterality Date  . EYE SURGERY         Home Medications    Prior to Admission medications   Medication Sig Start Date End Date Taking? Authorizing Provider  brimonidine (ALPHAGAN) 0.2 % ophthalmic solution Place 1 drop into the left eye 2 (two) times daily.   Yes [provider]  Multiple Vitamin (MULTIVITAMIN) tablet Take 1 tablet by mouth daily.   Yes [provider]  pantoprazole (PROTONIX) 40 MG tablet Take 40 mg by mouth 2 (two) times daily.   Yes [provider]  timolol (BETIMOL) 0.5 % ophthalmic solution Place 1 drop into the left eye 2 (two) times daily.   Yes [provider]  ALPRAZolam Duanne Moron) 0.5 MG tablet Take 0.5 mg by mouth at bedtime as needed for anxiety.    [provider]  benzonatate (TESSALON PERLES) 100 MG capsule Take 1 capsule (100 mg total) by mouth 3 (three) times daily as needed for cough. 11/15/15    [name], NP  Carboxymeth-Glycerin-Polysorb 0.5-1-0.5 % SOLN Apply 1 drop to eye as needed.    [provider]  chlorpheniramine-HYDROcodone (TUSSIONEX PENNKINETIC ER) 10-8 MG/5ML SUER Take 5 mLs by mouth every 12 (twelve) hours as needed for cough. 12/13/15   Frederich Cha, MD  guaiFENesin-codeine 100-10 MG/5ML syrup Take 5 mLs by mouth at bedtime as needed for cough (do not drive or operate machinery while taking as can cause drowsiness). 11/15/15   [name], NP  HYDROcodone-acetaminophen (NORCO) 5-325 MG tablet Take 1 tablet by mouth every 4 (four) hours as needed for moderate pain. 03/01/18   Duanne Guess, PA-C  oseltamivir (TAMIFLU) 75 MG capsule Take 1 capsule (75 mg total) by mouth 2 (two) times daily. 12/13/15   Frederich Cha, MD  tamsulosin (FLOMAX) 0.4 MG CAPS capsule Take 0.4 mg by mouth daily.    [provider]  traMADol (ULTRAM) 50 MG tablet Take 50 mg by mouth every 6 (six) hours as needed.    [provider]  valACYclovir (VALTREX) 500 MG tablet Take 500 mg by mouth 2 (two) times daily.     [provider]    Family History Family History  Problem Relation Age of Onset  . Cancer Mother   . Heart attack Father  Social History Social History   Tobacco Use  . Smoking status: Former Research scientist (life sciences)  . Smokeless tobacco: Never Used  Substance Use Topics  . Alcohol use: No  . Drug use: Not on file     Allergies   Combigan [brimonidine tartrate-timolol]; Lumigan [bimatoprost]; Moxeza [moxifloxacin]; Pravastatin; and Zirgan [ganciclovir]   Review of Systems Review of Systems  Constitutional: Negative for fever.  Respiratory: Negative for shortness of breath.   Cardiovascular: Negative for chest pain.  Genitourinary: Negative for difficulty urinating, dysuria and urgency.  Musculoskeletal: Positive for arthralgias. Negative for myalgias.  Skin: Positive for wound. Negative for color change and rash.  Neurological: Negative for  dizziness and headaches.     Physical Exam Triage Vital Signs ED Triage Vitals  Enc Vitals Group     BP 03/01/18 1835 (!) 150/75     Pulse Rate 03/01/18 1835 68     Resp 03/01/18 1835 16     Temp 03/01/18 1835 97.9 F (36.6 C)     Temp Source 03/01/18 1835 Oral     SpO2 03/01/18 1835 99 %     Weight 03/01/18 1831 190 lb (86.2 kg)     Height 03/01/18 1831 5\' 7"  (1.702 m)     Head Circumference --      Peak Flow --      Pain Score 03/01/18 1831 8     Pain Loc --      Pain Edu? --      Excl. in [place]? --    No data found.  Updated Vital Signs BP (!) 150/75 (BP Location: Left Arm)   Pulse 68   Temp 97.9 F (36.6 C) (Oral)   Resp 16   Ht 5\' 7"  (1.702 m)   Wt 190 lb (86.2 kg)   SpO2 99%   BMI 29.76 kg/m   Visual Acuity Right Eye Distance:   Left Eye Distance:   Bilateral Distance:    Right Eye Near:   Left Eye Near:    Bilateral Near:     Physical Exam  Constitutional: He is oriented to person, place, and time. He appears well-developed and well-nourished.  HENT:  Head: Normocephalic and atraumatic.  Eyes: Conjunctivae are normal.  Neck: Normal range of motion.  Cardiovascular: Normal rate.  Pulmonary/Chest: Effort normal. No respiratory distress.  Musculoskeletal:  Right hand with mild swelling to the distal phalanxes of the right third and fourth digit with subungual hematomas present, fourth digit greater than third.  No extensor lag along the DIP joints.  Flexion is limited.  No injury to the base of the nail.  Sensation is intact distally.  Patient minimally tender along the distal phalanx.  No lacerations noted.  Neurological: He is alert and oriented to person, place, and time.  Skin: Skin is warm. No rash noted.  Psychiatric: He has a normal mood and affect. His behavior is normal. Thought content normal.     UC Treatments / Results  Labs (all labs ordered are listed, but only abnormal results are displayed) Labs Reviewed - No data to  display  EKG None  Radiology Dg Hand Complete Right  Result Date: 03/01/2018 CLINICAL DATA:  Injury to the tips of the third and fourth digits. EXAM: RIGHT HAND - COMPLETE 3+ VIEW COMPARISON:  None. FINDINGS: There is no evidence of fracture or dislocation. Soft tissue swelling at the tufts of the third and fourth digits. IMPRESSION: No acute fracture or dislocation identified about the third and fourth digits. Electronically Signed  By: Fidela Salisbury M.D.   On: 03/01/2018 19:06    Procedures Incision and Drainage Date/Time: 03/01/2018 7:10 PM Performed by: Duanne Guess, PA-C Authorized by: Coral Spikes, DO   Consent:    Consent obtained:  Verbal   Consent given by:  Patient   Risks discussed:  Incomplete drainage Location:    Type:  Subungual hematoma Comments:     Right third and fourth digits with subungual hematoma.  Electrocautery stick used to place a hole through the nail on the medial lateral aspect, significant drainage of hematoma performed.  Patient with significant pain relief post right third and fourth digit subungual hematoma drainage.  Band-Aids and fingers stax splint applied to both digits.   (including critical care time)  Medications Ordered in UC Medications - No data to display  Initial Impression / Assessment and Plan / UC Course  I have reviewed the triage vital signs and the nursing notes.  Pertinent labs & imaging results that were available during my care of the patient were reviewed by me and considered in my medical decision making (see chart for details).     72 year old male with crush injury to the right third and fourth digit with subungual hematomas to the third and fourth digits.  Subungual hematomas drained with electrocautery stick and Band-Aid applied.  X-rays obtained showing no evidence of acute bony abnormality.  No extensor tendon injuries noted.  Patient is placed into finger Stax splints.  He is given prescription for Norco  pain.  Educated on signs symptoms return to clinic for. Final Clinical Impressions(s) / UC Diagnoses   Final diagnoses:  Subungual hematoma of digit of hand, initial encounter  Crushing injury of right middle finger, initial encounter  Crushing injury of right ring finger, initial encounter     Discharge Instructions     Please continue with Tylenol as needed for mild pain.  For moderate to severe pain you may use the hydrocodone with Tylenol.  Do not exceed more than 4000 mg of Tylenol in 24-hour period.  Rest and ice digits.  Follow-up with orthopedics if no improvement 1 week.    ED Prescriptions    Medication Sig Dispense Auth. Provider   HYDROcodone-acetaminophen (NORCO) 5-325 MG tablet Take 1 tablet by mouth every 4 (four) hours as needed for moderate pain. 20 tablet Duanne Guess, PA-C     Controlled Substance Prescriptions [place] Controlled Substance Registry consulted? Yes, I have consulted the [place] Controlled Substances Registry for this patient, and feel the risk/benefit ratio today is favorable for proceeding with this prescription for a controlled substance.   Duanne Guess, [place] 03/01/18 1912

## 2018-03-01 NOTE — ED Triage Notes (Signed)
Patient states that he was pulling down his garage door and his right 3rd and 4th finger got caught in the crease of the door today.  Patient c/o pain and bruising at his fingernails.

## 2018-03-01 NOTE — Discharge Instructions (Addendum)
Please continue with Tylenol as needed for mild pain.  For moderate to severe pain you may use the hydrocodone with Tylenol.  Do not exceed more than 4000 mg of Tylenol in 24-hour period.  Rest and ice digits.  Follow-up with orthopedics if no improvement 1 week.

## 2018-03-04 DIAGNOSIS — N401 Enlarged prostate with lower urinary tract symptoms: Secondary | ICD-10-CM | POA: Diagnosis not present

## 2018-03-04 DIAGNOSIS — G8929 Other chronic pain: Secondary | ICD-10-CM | POA: Diagnosis not present

## 2018-03-04 DIAGNOSIS — E7800 Pure hypercholesterolemia, unspecified: Secondary | ICD-10-CM | POA: Diagnosis not present

## 2018-03-04 DIAGNOSIS — K219 Gastro-esophageal reflux disease without esophagitis: Secondary | ICD-10-CM | POA: Diagnosis not present

## 2018-03-04 DIAGNOSIS — G4733 Obstructive sleep apnea (adult) (pediatric): Secondary | ICD-10-CM | POA: Diagnosis not present

## 2018-03-04 DIAGNOSIS — M545 Low back pain: Secondary | ICD-10-CM | POA: Diagnosis not present

## 2018-03-04 DIAGNOSIS — E119 Type 2 diabetes mellitus without complications: Secondary | ICD-10-CM | POA: Diagnosis not present

## 2018-03-11 DIAGNOSIS — G4733 Obstructive sleep apnea (adult) (pediatric): Secondary | ICD-10-CM | POA: Diagnosis not present

## 2018-03-18 DIAGNOSIS — H209 Unspecified iridocyclitis: Secondary | ICD-10-CM | POA: Diagnosis not present

## 2018-03-18 DIAGNOSIS — E669 Obesity, unspecified: Secondary | ICD-10-CM | POA: Diagnosis not present

## 2018-03-18 DIAGNOSIS — R7309 Other abnormal glucose: Secondary | ICD-10-CM | POA: Diagnosis not present

## 2018-03-18 DIAGNOSIS — K219 Gastro-esophageal reflux disease without esophagitis: Secondary | ICD-10-CM | POA: Diagnosis not present

## 2018-03-18 DIAGNOSIS — H4042X1 Glaucoma secondary to eye inflammation, left eye, mild stage: Secondary | ICD-10-CM | POA: Diagnosis not present

## 2018-03-18 DIAGNOSIS — G4733 Obstructive sleep apnea (adult) (pediatric): Secondary | ICD-10-CM | POA: Diagnosis not present

## 2018-04-11 DIAGNOSIS — G4733 Obstructive sleep apnea (adult) (pediatric): Secondary | ICD-10-CM | POA: Diagnosis not present

## 2018-05-12 DIAGNOSIS — G4733 Obstructive sleep apnea (adult) (pediatric): Secondary | ICD-10-CM | POA: Diagnosis not present

## 2018-06-11 DIAGNOSIS — G4733 Obstructive sleep apnea (adult) (pediatric): Secondary | ICD-10-CM | POA: Diagnosis not present

## 2018-07-12 DIAGNOSIS — G4733 Obstructive sleep apnea (adult) (pediatric): Secondary | ICD-10-CM | POA: Diagnosis not present

## 2018-08-20 DIAGNOSIS — E119 Type 2 diabetes mellitus without complications: Secondary | ICD-10-CM | POA: Diagnosis not present

## 2018-09-16 DIAGNOSIS — H209 Unspecified iridocyclitis: Secondary | ICD-10-CM | POA: Diagnosis not present

## 2018-09-16 DIAGNOSIS — H4042X1 Glaucoma secondary to eye inflammation, left eye, mild stage: Secondary | ICD-10-CM | POA: Diagnosis not present

## 2018-09-21 DIAGNOSIS — Z0000 Encounter for general adult medical examination without abnormal findings: Secondary | ICD-10-CM | POA: Diagnosis not present

## 2018-09-27 DIAGNOSIS — N401 Enlarged prostate with lower urinary tract symptoms: Secondary | ICD-10-CM | POA: Diagnosis not present

## 2018-09-27 DIAGNOSIS — G8929 Other chronic pain: Secondary | ICD-10-CM | POA: Diagnosis not present

## 2018-09-27 DIAGNOSIS — K219 Gastro-esophageal reflux disease without esophagitis: Secondary | ICD-10-CM | POA: Diagnosis not present

## 2018-09-27 DIAGNOSIS — M545 Low back pain: Secondary | ICD-10-CM | POA: Diagnosis not present

## 2018-09-27 DIAGNOSIS — E7800 Pure hypercholesterolemia, unspecified: Secondary | ICD-10-CM | POA: Diagnosis not present

## 2018-09-27 DIAGNOSIS — G4733 Obstructive sleep apnea (adult) (pediatric): Secondary | ICD-10-CM | POA: Diagnosis not present

## 2018-09-27 DIAGNOSIS — M791 Myalgia, unspecified site: Secondary | ICD-10-CM | POA: Diagnosis not present

## 2018-09-27 DIAGNOSIS — E119 Type 2 diabetes mellitus without complications: Secondary | ICD-10-CM | POA: Diagnosis not present

## 2018-10-06 ENCOUNTER — Other Ambulatory Visit: Payer: Self-pay

## 2018-10-06 ENCOUNTER — Encounter: Payer: Self-pay | Admitting: Emergency Medicine

## 2018-10-06 ENCOUNTER — Ambulatory Visit
Admission: EM | Admit: 2018-10-06 | Discharge: 2018-10-06 | Disposition: A | Discharge status: Home / Self Care | Payer: PPO | Attending: Family Medicine | Admitting: Family Medicine

## 2018-10-06 DIAGNOSIS — Z23 Encounter for immunization: Secondary | ICD-10-CM

## 2018-10-06 DIAGNOSIS — W540XXA Bitten by dog, initial encounter: Secondary | ICD-10-CM | POA: Diagnosis not present

## 2018-10-06 DIAGNOSIS — S61431A Puncture wound without foreign body of right hand, initial encounter: Secondary | ICD-10-CM

## 2018-10-06 MED ORDER — TETANUS-DIPHTH-ACELL PERTUSSIS 5-2.5-18.5 LF-MCG/0.5 IM SUSP
0.5000 mL | Freq: Once | INTRAMUSCULAR | Status: AC
Start: 2018-10-06 — End: 2018-10-06
  Administered 2018-10-06: 0.5 mL via INTRAMUSCULAR

## 2018-10-06 NOTE — ED Triage Notes (Signed)
Pt was bite by his dog on his right hand. Per pt rabies vaccine is up to date on the dog. Abrasions on hands are small and red, hand is swollen in the area. incident occurred this morning. No record of tetanus vaccine.

## 2018-10-06 NOTE — Discharge Instructions (Signed)
Take the antibiotic as prescribed by your doctor.  Tetanus given today.  Keep a close eye on the wound.  Take care  Dr. Lacinda Axon

## 2018-10-06 NOTE — ED Provider Notes (Signed)
[facility]    CSN: [id number] Arrival date & time: 10/06/18  1532  History   Chief Complaint Chief Complaint  Patient presents with  . Animal Bite   HPI   73 year old male presents with a dog bite.  Patient states that he attempted to pick his dog up.  He states that his dog is been having medical problems and was in pain.  When he picked him up, he bit him on the right hand.  Patient has 2 small puncture wounds on the dorsum of the right hand.  Mild swelling.  Injury occurred this morning.  Unsure of last tetanus.  No bleeding at this time.  Moderate pain.  Patient states that he contacted his insurance company and a physician has already called in a prescription for Augmentin.  He is here for a tetanus vaccine and to also discuss an x-ray.  No other associated symptoms.  No other complaints.  PMH, Surgical Hx, Family Hx, Social History reviewed and updated as below.  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  Diabetes mellitus type 2, uncomplicated (CMS-HCC)    Arthritis    BPH (benign prostatic hypertrophy)    Helicobacter pylori gastritis    Spinal curvature    Vitamin D deficiency 04/16/2013   Kidney stone  Last episode 2013, spontaneously passed  Wears glasses    Chronic back pain  Seen by Chiropractor 3x per week for manipulation  Hematospermia    Hyperlipidemia    Retinal detachment  x2 same eye  PONV (postoperative nausea and vomiting), unspecified  did well with procedure on 09/02/2014  Sleep apnea 2018 using CPAP  GERD (gastroesophageal reflux disease)    Obesity (BMI 30.0-34.9), unspecified    Refusal of blood transfusions as patient is Jehovah's Witness 02/18/2018   Advance directive on file 02/18/2018 His [place] document was witnessed, signed, and notarized on 06/26/2015  and is included in the [facility] medical record. HPOA agent # 1: Paul L. Lennox Grumbles, Sr. (elder) Contact numbers for #1: [phone] or [phone] (M)    Surgical Hx:  GLAUCOMA EYE SURGERY 12/29/11 Left [facility] W/SPG-DR.[place] SURGERY 10/12/11 Left DR.CARLSON combined with [facility], [place] MA60AC, +19.0 D   CORNEAL EYE SURGERY 10/12/11 Left [facility] triple, DR.CARLSON   CHOLECYSTECTOMY OPEN      foot surgery 09/04/1961 - 09/03/1962     CORNEAL TRANSPLANT DSAEK 12/19/2012 Eye/Left Procedure: CORNEAL TRANSPLANT DSAEK; Surgeon: Tomma Rakers, MD; Location: [place]; Service: Ophthalmology; Laterality: Left; This patient should not be dilated unless otherwise specified.  Medical devices from this surgery are in the Implants section.   COLONOSCOPY W/REMOVAL LESIONS BY SNARE 07/25/2013 N/A Procedure: COLONOSCOPY W/REMOVAL LESIONS BY SNARE; Surgeon: Erin Sons, MD; Location: [place]; Service: Gastroenterology; Laterality: N/A;   COLONOSCOPY W/BIOPSY 07/25/2013  Procedure: COLONOSCOPY W/BIOPSY; Surgeon: Erin Sons, MD; Location: [facility]; Service: Gastroenterology;;   VITREOUS RETINAL SURGERY   status post 25g PPV/PFO/EL/FAX/20% SF6 OS (08/18/2014 - [facility])   REPAIR RETINAL DETACHMENT W/VITRECTOMY 09/02/2014 Eye/Left Procedure: REPAIR RETINAL DETACHMENT W/VITRECTOMY 25G segmental SB; Surgeon: Cyndy Freeze, MD; Location: [place]; Service: Ophthalmology; Laterality: Left; PAT phone call 12.29.2015  Medical devices from this surgery are in the Implants section.   PARACENTESIS ANTERIOR CHAMBER EYE W/WASHOUT 02/18/2018 Eye/Left Procedure: PARACENTESIS OF ANTERIOR CHAMBER OF EYE (SEPARATE PROCEDURE); WITH REMOVAL OF  AQUEOUS; Surgeon: Ross Ludwig, MD; Location: [place]; Service: Ophthalmology; Laterality: Left; check for viral etiologies  Medical devices from this surgery are in the Implants section.    Home Medications      Prior to Admission medications   Medication Sig Start Date End Date Taking? Authorizing Provider  brimonidine (ALPHAGAN) 0.2 % ophthalmic solution Place 1 drop into the left eye 2 (two) times daily.   Yes [provider]  Carboxymeth-Glycerin-Polysorb 0.5-1-0.5 % SOLN Apply 1 drop to eye as needed.   Yes [provider]  Multiple Vitamin (MULTIVITAMIN) tablet Take 1 tablet by mouth daily.   Yes [provider]  pantoprazole (PROTONIX) 40 MG tablet Take 40 mg by mouth 2 (two) times daily.   Yes [provider]  timolol (BETIMOL) 0.5 % ophthalmic solution Place 1 drop into the left eye 2 (two) times daily.   Yes [provider]  traMADol (ULTRAM) 50 MG tablet Take 50 mg by mouth every 6 (six) hours as needed.   Yes [provider]  valACYclovir (VALTREX) 500 MG tablet Take 500 mg by mouth 2 (two) times daily.    Yes [provider]    Family History Family History  Problem Relation Age of Onset  . Cancer Mother   . Heart attack Father     Social History Social History   Tobacco Use  . Smoking status: Former Research scientist (life sciences)  . Smokeless tobacco: Never Used  Substance Use Topics  . Alcohol use: No  . Drug use: Not Currently     Allergies   Combigan [brimonidine tartrate-timolol]; Lumigan [bimatoprost]; Moxeza [moxifloxacin]; Pravastatin; and Zirgan [ganciclovir]   Review of Systems Review of Systems  Constitutional: Negative.   Skin: Positive for wound.   Physical Exam Triage Vital Signs ED Triage Vitals  Enc Vitals Group     BP 10/06/18 1556 123/78     Pulse Rate 10/06/18 1556 93     Resp 10/06/18 1556 18     Temp 10/06/18 1556 99.2 F (37.3 C)     Temp Source 10/06/18 1556 Oral     SpO2 10/06/18 1556 95 %     Weight 10/06/18 1549 187 lb (84.8 kg)     Height 10/06/18 1549 5\' 6"  (1.676 m)     Head Circumference --      Peak Flow --      Pain Score 10/06/18 1549 5     Pain Loc --      Pain Edu? --      Excl.  in [place]? --    Updated Vital Signs BP 123/78 (BP Location: Left Arm)   Pulse 93   Temp 99.2 F (37.3 C) (Oral)   Resp 18   Ht 5\' 6"  (1.676 m)   Wt 84.8 kg   SpO2 95%   BMI 30.18 kg/m   Visual Acuity Right Eye Distance:   Left Eye Distance:   Bilateral Distance:    Right Eye Near:   Left Eye Near:    Bilateral Near:     Physical Exam Vitals signs and nursing note reviewed.  Constitutional:      General: He is not in acute distress.    Appearance: Normal appearance.  HENT:     Head: Normocephalic and atraumatic.     Nose: Nose normal.  Eyes:     General:        Right eye: No discharge.        Left eye: No discharge.  Conjunctiva/sclera: Conjunctivae normal.  Pulmonary:     Effort: Pulmonary effort is normal. No respiratory distress.  Skin:    Comments: Right hand -dorsal right hand with 2 small puncture wounds.  No bleeding at this time.  No purulent drainage.  Mild swelling of the dorsum of the hand.  Neurological:     Mental Status: He is alert.  Psychiatric:        Mood and Affect: Mood normal.        Behavior: Behavior normal.    UC Treatments / Results  Labs (all labs ordered are listed, but only abnormal results are displayed) Labs Reviewed - No data to display  EKG None  Radiology No results found.  Procedures Procedures (including critical care time)  Medications Ordered in UC Medications  Tdap (BOOSTRIX) injection 0.5 mL (0.5 mLs Intramuscular Given 10/06/18 1600)    Initial Impression / Assessment and Plan / UC Course  I have reviewed the triage vital signs and the nursing notes.  Pertinent labs & imaging results that were available during my care of the patient were reviewed by me and considered in my medical decision making (see chart for details).    73 year old male presents with a dog bite.  Patient was already called in a prescription for Augmentin for his wound.  Wound was cleaned today.  Tetanus given.  No indication for x-ray.   Supportive care.  Final Clinical Impressions(s) / UC Diagnoses   Final diagnoses:  Dog bite, initial encounter     Discharge Instructions     Take the antibiotic as prescribed by your doctor.  Tetanus given today.  Keep a close eye on the wound.  Take care  Dr. Lacinda Axon    ED Prescriptions    None     Controlled Substance Prescriptions [place] Controlled Substance Registry consulted? Not Applicable   Coral Spikes, DO 10/06/18 1713

## 2018-10-06 NOTE — ED Triage Notes (Signed)
Animal control contacted and will contact patient.

## 2019-01-30 DIAGNOSIS — Z947 Corneal transplant status: Secondary | ICD-10-CM | POA: Diagnosis not present

## 2019-02-10 DIAGNOSIS — L821 Other seborrheic keratosis: Secondary | ICD-10-CM | POA: Diagnosis not present

## 2019-02-10 DIAGNOSIS — L538 Other specified erythematous conditions: Secondary | ICD-10-CM | POA: Diagnosis not present

## 2019-02-10 DIAGNOSIS — D235 Other benign neoplasm of skin of trunk: Secondary | ICD-10-CM | POA: Diagnosis not present

## 2019-02-10 DIAGNOSIS — X32XXXA Exposure to sunlight, initial encounter: Secondary | ICD-10-CM | POA: Diagnosis not present

## 2019-02-10 DIAGNOSIS — L570 Actinic keratosis: Secondary | ICD-10-CM | POA: Diagnosis not present

## 2019-02-10 DIAGNOSIS — R208 Other disturbances of skin sensation: Secondary | ICD-10-CM | POA: Diagnosis not present

## 2019-02-10 DIAGNOSIS — L820 Inflamed seborrheic keratosis: Secondary | ICD-10-CM | POA: Diagnosis not present

## 2019-03-17 DIAGNOSIS — H209 Unspecified iridocyclitis: Secondary | ICD-10-CM | POA: Diagnosis not present

## 2019-03-17 DIAGNOSIS — H4042X1 Glaucoma secondary to eye inflammation, left eye, mild stage: Secondary | ICD-10-CM | POA: Diagnosis not present

## 2019-03-17 DIAGNOSIS — Z947 Corneal transplant status: Secondary | ICD-10-CM | POA: Diagnosis not present

## 2019-04-07 DIAGNOSIS — K219 Gastro-esophageal reflux disease without esophagitis: Secondary | ICD-10-CM | POA: Diagnosis not present

## 2019-04-07 DIAGNOSIS — E119 Type 2 diabetes mellitus without complications: Secondary | ICD-10-CM | POA: Diagnosis not present

## 2019-04-07 DIAGNOSIS — N401 Enlarged prostate with lower urinary tract symptoms: Secondary | ICD-10-CM | POA: Diagnosis not present

## 2019-04-07 DIAGNOSIS — G8929 Other chronic pain: Secondary | ICD-10-CM | POA: Diagnosis not present

## 2019-04-07 DIAGNOSIS — M545 Low back pain: Secondary | ICD-10-CM | POA: Diagnosis not present

## 2019-04-07 DIAGNOSIS — E7800 Pure hypercholesterolemia, unspecified: Secondary | ICD-10-CM | POA: Diagnosis not present

## 2019-04-07 DIAGNOSIS — Z0000 Encounter for general adult medical examination without abnormal findings: Secondary | ICD-10-CM | POA: Diagnosis not present

## 2019-04-07 DIAGNOSIS — Z1159 Encounter for screening for other viral diseases: Secondary | ICD-10-CM | POA: Diagnosis not present

## 2019-04-07 DIAGNOSIS — G4733 Obstructive sleep apnea (adult) (pediatric): Secondary | ICD-10-CM | POA: Diagnosis not present

## 2019-04-07 DIAGNOSIS — Z125 Encounter for screening for malignant neoplasm of prostate: Secondary | ICD-10-CM | POA: Diagnosis not present

## 2019-04-07 DIAGNOSIS — Z9189 Other specified personal risk factors, not elsewhere classified: Secondary | ICD-10-CM | POA: Diagnosis not present

## 2019-06-13 DIAGNOSIS — R413 Other amnesia: Secondary | ICD-10-CM | POA: Diagnosis not present

## 2019-06-13 DIAGNOSIS — R1031 Right lower quadrant pain: Secondary | ICD-10-CM | POA: Diagnosis not present

## 2019-06-16 ENCOUNTER — Other Ambulatory Visit (HOSPITAL_COMMUNITY): Payer: Self-pay | Admitting: Gerontology

## 2019-06-16 ENCOUNTER — Other Ambulatory Visit: Payer: Self-pay | Admitting: Gerontology

## 2019-06-18 ENCOUNTER — Other Ambulatory Visit: Payer: Self-pay | Admitting: Gerontology

## 2019-06-18 DIAGNOSIS — R1031 Right lower quadrant pain: Secondary | ICD-10-CM

## 2019-06-24 ENCOUNTER — Ambulatory Visit
Admission: RE | Admit: 2019-06-24 | Discharge: 2019-06-24 | Disposition: A | Discharge status: Home / Self Care | Payer: PPO | Source: Ambulatory Visit | Attending: Gerontology | Admitting: Gerontology

## 2019-06-24 ENCOUNTER — Other Ambulatory Visit: Payer: Self-pay

## 2019-06-24 ENCOUNTER — Encounter (INDEPENDENT_AMBULATORY_CARE_PROVIDER_SITE_OTHER): Payer: Self-pay

## 2019-06-24 DIAGNOSIS — R1030 Lower abdominal pain, unspecified: Secondary | ICD-10-CM | POA: Diagnosis not present

## 2019-06-24 DIAGNOSIS — R1031 Right lower quadrant pain: Secondary | ICD-10-CM | POA: Diagnosis not present

## 2019-07-08 DIAGNOSIS — L720 Epidermal cyst: Secondary | ICD-10-CM | POA: Diagnosis not present

## 2019-07-08 DIAGNOSIS — L570 Actinic keratosis: Secondary | ICD-10-CM | POA: Diagnosis not present

## 2019-07-28 DIAGNOSIS — L570 Actinic keratosis: Secondary | ICD-10-CM | POA: Diagnosis not present

## 2019-07-28 DIAGNOSIS — L720 Epidermal cyst: Secondary | ICD-10-CM | POA: Diagnosis not present

## 2019-08-14 DIAGNOSIS — Z947 Corneal transplant status: Secondary | ICD-10-CM | POA: Diagnosis not present

## 2019-08-14 DIAGNOSIS — Z961 Presence of intraocular lens: Secondary | ICD-10-CM | POA: Diagnosis not present

## 2019-08-14 DIAGNOSIS — H2511 Age-related nuclear cataract, right eye: Secondary | ICD-10-CM | POA: Diagnosis not present

## 2019-10-01 DIAGNOSIS — D3702 Neoplasm of uncertain behavior of tongue: Secondary | ICD-10-CM | POA: Diagnosis not present

## 2019-10-01 DIAGNOSIS — D101 Benign neoplasm of tongue: Secondary | ICD-10-CM | POA: Diagnosis not present

## 2019-10-06 DIAGNOSIS — H4042X1 Glaucoma secondary to eye inflammation, left eye, mild stage: Secondary | ICD-10-CM | POA: Diagnosis not present

## 2019-10-06 DIAGNOSIS — H209 Unspecified iridocyclitis: Secondary | ICD-10-CM | POA: Diagnosis not present

## 2019-10-16 DIAGNOSIS — Z961 Presence of intraocular lens: Secondary | ICD-10-CM | POA: Diagnosis not present

## 2019-10-16 DIAGNOSIS — H2511 Age-related nuclear cataract, right eye: Secondary | ICD-10-CM | POA: Diagnosis not present

## 2019-10-16 DIAGNOSIS — Z947 Corneal transplant status: Secondary | ICD-10-CM | POA: Diagnosis not present

## 2019-10-17 DIAGNOSIS — E7800 Pure hypercholesterolemia, unspecified: Secondary | ICD-10-CM | POA: Diagnosis not present

## 2019-10-17 DIAGNOSIS — G4733 Obstructive sleep apnea (adult) (pediatric): Secondary | ICD-10-CM | POA: Diagnosis not present

## 2019-10-17 DIAGNOSIS — G8929 Other chronic pain: Secondary | ICD-10-CM | POA: Diagnosis not present

## 2019-10-17 DIAGNOSIS — K219 Gastro-esophageal reflux disease without esophagitis: Secondary | ICD-10-CM | POA: Diagnosis not present

## 2019-10-17 DIAGNOSIS — E119 Type 2 diabetes mellitus without complications: Secondary | ICD-10-CM | POA: Diagnosis not present

## 2019-10-17 DIAGNOSIS — M545 Low back pain: Secondary | ICD-10-CM | POA: Diagnosis not present

## 2019-10-17 DIAGNOSIS — N529 Male erectile dysfunction, unspecified: Secondary | ICD-10-CM | POA: Diagnosis not present

## 2019-10-17 DIAGNOSIS — Z0000 Encounter for general adult medical examination without abnormal findings: Secondary | ICD-10-CM | POA: Diagnosis not present

## 2019-10-17 DIAGNOSIS — N401 Enlarged prostate with lower urinary tract symptoms: Secondary | ICD-10-CM | POA: Diagnosis not present

## 2019-11-27 DIAGNOSIS — I83893 Varicose veins of bilateral lower extremities with other complications: Secondary | ICD-10-CM | POA: Diagnosis not present

## 2019-11-27 DIAGNOSIS — L603 Nail dystrophy: Secondary | ICD-10-CM | POA: Diagnosis not present

## 2019-11-27 DIAGNOSIS — E1142 Type 2 diabetes mellitus with diabetic polyneuropathy: Secondary | ICD-10-CM | POA: Diagnosis not present

## 2020-02-26 DIAGNOSIS — H2511 Age-related nuclear cataract, right eye: Secondary | ICD-10-CM | POA: Diagnosis not present

## 2020-02-26 DIAGNOSIS — Z961 Presence of intraocular lens: Secondary | ICD-10-CM | POA: Diagnosis not present

## 2020-02-26 DIAGNOSIS — Z947 Corneal transplant status: Secondary | ICD-10-CM | POA: Diagnosis not present

## 2020-03-04 DIAGNOSIS — E1142 Type 2 diabetes mellitus with diabetic polyneuropathy: Secondary | ICD-10-CM | POA: Diagnosis not present

## 2020-03-04 DIAGNOSIS — L603 Nail dystrophy: Secondary | ICD-10-CM | POA: Diagnosis not present

## 2020-05-03 DIAGNOSIS — G8929 Other chronic pain: Secondary | ICD-10-CM | POA: Diagnosis not present

## 2020-05-03 DIAGNOSIS — N401 Enlarged prostate with lower urinary tract symptoms: Secondary | ICD-10-CM | POA: Diagnosis not present

## 2020-05-03 DIAGNOSIS — G4733 Obstructive sleep apnea (adult) (pediatric): Secondary | ICD-10-CM | POA: Diagnosis not present

## 2020-05-03 DIAGNOSIS — N529 Male erectile dysfunction, unspecified: Secondary | ICD-10-CM | POA: Diagnosis not present

## 2020-05-03 DIAGNOSIS — E7800 Pure hypercholesterolemia, unspecified: Secondary | ICD-10-CM | POA: Diagnosis not present

## 2020-05-03 DIAGNOSIS — E1142 Type 2 diabetes mellitus with diabetic polyneuropathy: Secondary | ICD-10-CM | POA: Diagnosis not present

## 2020-05-03 DIAGNOSIS — M545 Low back pain: Secondary | ICD-10-CM | POA: Diagnosis not present

## 2020-05-03 DIAGNOSIS — K219 Gastro-esophageal reflux disease without esophagitis: Secondary | ICD-10-CM | POA: Diagnosis not present

## 2020-05-28 ENCOUNTER — Ambulatory Visit (INDEPENDENT_AMBULATORY_CARE_PROVIDER_SITE_OTHER): Payer: PPO

## 2020-05-28 ENCOUNTER — Ambulatory Visit
Admission: EM | Admit: 2020-05-28 | Discharge: 2020-05-28 | Disposition: A | Discharge status: Home / Self Care | Payer: PPO

## 2020-05-28 DIAGNOSIS — T50905A Adverse effect of unspecified drugs, medicaments and biological substances, initial encounter: Secondary | ICD-10-CM | POA: Diagnosis not present

## 2020-05-28 DIAGNOSIS — R0989 Other specified symptoms and signs involving the circulatory and respiratory systems: Secondary | ICD-10-CM | POA: Diagnosis not present

## 2020-05-28 DIAGNOSIS — K208 Other esophagitis without bleeding: Secondary | ICD-10-CM

## 2020-05-28 MED ORDER — SUCRALFATE 1 GM/10ML PO SUSP: 1.0000 g | Freq: Three times a day (TID) | ORAL | 0 refills | Status: AC

## 2020-05-28 NOTE — Discharge Instructions (Signed)
Begin Carafate at this time.  Drink plenty of fluids and eat small bites.  If you have any vomiting of dark black material or blood you should go to the emergency room.  If you have any chest pain or breathing difficulty go to the emergency room.  Otherwise, your symptoms should resolve over the next few days.  Follow-up with GI specialist if you feel you are having difficulty swallowing to have further work-up including endoscopy.  [facility] (Gastroenterology) Phone: [phone] Please contact the following number for appointment with GI

## 2020-05-28 NOTE — ED Triage Notes (Signed)
Patient in today w/ c/o choking on an OTC pill a few minutes ago.

## 2020-05-28 NOTE — ED Provider Notes (Signed)
[facility]    CSN: [id number] Arrival date & time: 05/28/20  1416      History   Chief Complaint Chief Complaint  Patient presents with  . Gastroesophageal Reflux    Choked on a pill a few minutes ago.    HPI Daniel Manning is a 74 y.o. male.   Patient presents with wife for concerns after a choking accident that happened about an hour ago.  He says that he was trying to swallow a capsule that he got over-the-counter when it went go down and he started choking.  He says that he then started hitting himself forcefully in the chest and coughing violently.  He says at that time he coughed up some dark material and small flecks of bright blood.  He does he had some tenderness in the chest when he did this, but he does not have any chest pain at this time.  He says that he swallowed another pill after the one that he choked on.  He denies any history of esophageal strictures or spasms.  He says he is concerned because of the dark material that was coughed up and he also had some yellowish-green mucus.  He denies any vomiting.  Denies dark stools.  No breathing difficulty.  No abdominal pain or diarrhea.  He has no other concerns today.  HPI  Past Medical History:  Diagnosis Date  . Glaucoma     There are no problems to display for this patient.   Past Surgical History:  Procedure Laterality Date  . EYE SURGERY         Home Medications    Prior to Admission medications   Medication Sig Start Date End Date Taking? Authorizing Provider  brimonidine (ALPHAGAN) 0.2 % ophthalmic solution Place 1 drop into the left eye 2 (two) times daily.   Yes [provider]  Carboxymeth-Glycerin-Polysorb 0.5-1-0.5 % SOLN Apply 1 drop to eye as needed.   Yes [provider]  donepezil (ARICEPT) 5 MG tablet Take by mouth. 06/13/19 06/12/20 Yes [provider]  Multiple Vitamin (MULTIVITAMIN) tablet Take 1 tablet by mouth daily.   Yes [provider]    pantoprazole (PROTONIX) 40 MG tablet Take 40 mg by mouth 2 (two) times daily.   Yes [provider]  timolol (BETIMOL) 0.5 % ophthalmic solution Place 1 drop into the left eye 2 (two) times daily.   Yes [provider]  traMADol (ULTRAM) 50 MG tablet Take 50 mg by mouth every 6 (six) hours as needed.   Yes [provider]  valACYclovir (VALTREX) 500 MG tablet Take 500 mg by mouth 2 (two) times daily.    Yes [provider]  sucralfate (CARAFATE) 1 GM/10ML suspension Take 10 mLs (1 g total) by mouth 4 (four) times daily -  with meals and at bedtime for 7 days. 05/28/20 06/04/20  Danton Clap, PA-C    Family History Family History  Problem Relation Age of Onset  . Cancer Mother   . Heart attack Father     Social History Social History   Tobacco Use  . Smoking status: Former Research scientist (life sciences)  . Smokeless tobacco: Never Used  Vaping Use  . Vaping Use: Never used  Substance Use Topics  . Alcohol use: No  . Drug use: Not Currently     Allergies   Combigan [brimonidine tartrate-timolol], Lumigan [bimatoprost], Moxeza [moxifloxacin], Pravastatin, and Zirgan [ganciclovir]   Review of Systems Review of Systems  Constitutional: Negative for  fatigue and fever.  Respiratory: Positive for choking. Negative for cough, shortness of breath and wheezing.   Cardiovascular: Negative for chest pain.  Gastrointestinal: Negative for abdominal pain, blood in stool, diarrhea, nausea and vomiting.  Musculoskeletal: Negative for arthralgias and back pain.  Skin: Negative for color change and wound.  Neurological: Negative for dizziness and weakness.  Hematological: Does not bruise/bleed easily.     Physical Exam Triage Vital Signs ED Triage Vitals  Enc Vitals Group     BP 05/28/20 1423 (!) 168/86     Pulse Rate 05/28/20 1423 63     Resp 05/28/20 1423 18     Temp 05/28/20 1423 97.7 F (36.5 C)     Temp Source 05/28/20 1423 Oral     SpO2 05/28/20 1423 99 %      Weight 05/28/20 1424 180 lb (81.6 kg)     Height 05/28/20 1424 5\' 7"  (1.702 m)     Head Circumference --      Peak Flow --      Pain Score 05/28/20 1424 0     Pain Loc --      Pain Edu? --      Excl. in [place]? --    No data found.  Updated Vital Signs BP (!) 168/86 (BP Location: Left Arm)   Pulse 63   Temp 97.7 F (36.5 C) (Oral)   Resp 18   Ht 5\' 7"  (1.702 m)   Wt 180 lb (81.6 kg)   SpO2 99%   BMI 28.19 kg/m       Physical Exam Vitals and nursing note reviewed.  Constitutional:      General: He is not in acute distress.    Appearance: Normal appearance. He is well-developed and normal weight. He is not ill-appearing or toxic-appearing.  HENT:     Head: Normocephalic and atraumatic.     Nose: Nose normal.     Mouth/Throat:     Mouth: Mucous membranes are moist.     Pharynx: Oropharynx is clear. Posterior oropharyngeal erythema present.  Eyes:     General: No scleral icterus.    Conjunctiva/sclera: Conjunctivae normal.  Cardiovascular:     Rate and Rhythm: Normal rate and regular rhythm.     Heart sounds: Normal heart sounds.  Pulmonary:     Effort: Pulmonary effort is normal. No respiratory distress.     Breath sounds: Normal breath sounds. No rhonchi or rales.  Chest:     Chest wall: No tenderness.  Abdominal:     Palpations: Abdomen is soft.     Tenderness: There is no abdominal tenderness.  Musculoskeletal:     Cervical back: Neck supple.  Skin:    General: Skin is warm and dry.     Findings: No bruising or lesion.  Neurological:     General: No focal deficit present.     Mental Status: He is alert. Mental status is at baseline.     Motor: No weakness.     Gait: Gait normal.  Psychiatric:        Mood and Affect: Mood normal.        Behavior: Behavior normal.        Thought Content: Thought content normal.      UC Treatments / Results  Labs (all labs ordered are listed, but only abnormal results are displayed) Labs Reviewed - No data to  display  EKG   Radiology DG Chest 2 View  Result Date: 05/28/2020 CLINICAL DATA:  Choked  on pills.  Feels like still stuck in chest. EXAM: CHEST - 2 VIEW COMPARISON:  None. FINDINGS: The cardiac silhouette, mediastinal and hilar contours are within normal limits. The lungs are clear. No pleural effusions. No pneumothorax. No pulmonary lesions. No radiopaque foreign body. The bony thorax is intact. IMPRESSION: No acute cardiopulmonary findings. Electronically Signed   By: Marijo Sanes M.D.   On: 05/28/2020 15:08    Procedures Procedures (including critical care time)  Medications Ordered in UC Medications - No data to display  Initial Impression / Assessment and Plan / UC Course  I have reviewed the triage vital signs and the nursing notes.  Pertinent labs & imaging results that were available during my care of the patient were reviewed by me and considered in my medical decision making (see chart for details).   Imaging obtained today to rule out any possible sternal fractures, radiopaque foreign bodies or evidence of perforation.  X-ray normal today.  Discussed resolved patient and his wife.  Advised Carafate and increasing fluids as well as eating smaller bites of food.  If he has trouble swallowing that continues he should follow-up with GI and I provided him with a contact number.  ED precautions discussed for any worsening symptoms or new symptoms.  Patient and wife agreeable.   Final Clinical Impressions(s) / UC Diagnoses   Final diagnoses:  Choking in adult  Pill esophagitis     Discharge Instructions     Begin Carafate at this time.  Drink plenty of fluids and eat small bites.  If you have any vomiting of dark black material or blood you should go to the emergency room.  If you have any chest pain or breathing difficulty go to the emergency room.  Otherwise, your symptoms should resolve over the next few days.  Follow-up with GI specialist if you feel you are having  difficulty swallowing to have further work-up including endoscopy.  [facility] (Gastroenterology) Phone: [phone] Please contact the following number for appointment with GI     ED Prescriptions    Medication Sig Dispense Auth. Provider   sucralfate (CARAFATE) 1 GM/10ML suspension Take 10 mLs (1 g total) by mouth 4 (four) times daily -  with meals and at bedtime for 7 days. 280 mL Danton Clap, PA-C     PDMP not reviewed this encounter.   Danton Clap, PA-C 05/28/20 1520

## 2020-06-07 DIAGNOSIS — H4042X1 Glaucoma secondary to eye inflammation, left eye, mild stage: Secondary | ICD-10-CM | POA: Diagnosis not present

## 2020-06-07 DIAGNOSIS — H209 Unspecified iridocyclitis: Secondary | ICD-10-CM | POA: Diagnosis not present

## 2020-06-10 DIAGNOSIS — E1142 Type 2 diabetes mellitus with diabetic polyneuropathy: Secondary | ICD-10-CM | POA: Diagnosis not present

## 2020-06-10 DIAGNOSIS — L603 Nail dystrophy: Secondary | ICD-10-CM | POA: Diagnosis not present

## 2020-07-27 DIAGNOSIS — L570 Actinic keratosis: Secondary | ICD-10-CM | POA: Diagnosis not present

## 2020-07-27 DIAGNOSIS — L720 Epidermal cyst: Secondary | ICD-10-CM | POA: Diagnosis not present

## 2020-07-27 DIAGNOSIS — L821 Other seborrheic keratosis: Secondary | ICD-10-CM | POA: Diagnosis not present

## 2020-09-09 DIAGNOSIS — M792 Neuralgia and neuritis, unspecified: Secondary | ICD-10-CM | POA: Diagnosis not present

## 2020-09-09 DIAGNOSIS — E1142 Type 2 diabetes mellitus with diabetic polyneuropathy: Secondary | ICD-10-CM | POA: Diagnosis not present

## 2020-09-09 DIAGNOSIS — L603 Nail dystrophy: Secondary | ICD-10-CM | POA: Diagnosis not present

## 2020-09-09 DIAGNOSIS — R252 Cramp and spasm: Secondary | ICD-10-CM | POA: Diagnosis not present

## 2020-09-09 DIAGNOSIS — I83893 Varicose veins of bilateral lower extremities with other complications: Secondary | ICD-10-CM | POA: Diagnosis not present

## 2020-11-10 DIAGNOSIS — G8929 Other chronic pain: Secondary | ICD-10-CM | POA: Diagnosis not present

## 2020-11-10 DIAGNOSIS — E7800 Pure hypercholesterolemia, unspecified: Secondary | ICD-10-CM | POA: Diagnosis not present

## 2020-11-10 DIAGNOSIS — E1142 Type 2 diabetes mellitus with diabetic polyneuropathy: Secondary | ICD-10-CM | POA: Diagnosis not present

## 2020-11-10 DIAGNOSIS — M5442 Lumbago with sciatica, left side: Secondary | ICD-10-CM | POA: Diagnosis not present

## 2020-11-10 DIAGNOSIS — N529 Male erectile dysfunction, unspecified: Secondary | ICD-10-CM | POA: Diagnosis not present

## 2020-11-10 DIAGNOSIS — Z0000 Encounter for general adult medical examination without abnormal findings: Secondary | ICD-10-CM | POA: Diagnosis not present

## 2020-11-10 DIAGNOSIS — N401 Enlarged prostate with lower urinary tract symptoms: Secondary | ICD-10-CM | POA: Diagnosis not present

## 2020-11-10 DIAGNOSIS — G4733 Obstructive sleep apnea (adult) (pediatric): Secondary | ICD-10-CM | POA: Diagnosis not present

## 2020-11-10 DIAGNOSIS — K219 Gastro-esophageal reflux disease without esophagitis: Secondary | ICD-10-CM | POA: Diagnosis not present

## 2020-11-18 DIAGNOSIS — Z961 Presence of intraocular lens: Secondary | ICD-10-CM | POA: Diagnosis not present

## 2020-12-06 DIAGNOSIS — L578 Other skin changes due to chronic exposure to nonionizing radiation: Secondary | ICD-10-CM | POA: Diagnosis not present

## 2020-12-06 DIAGNOSIS — Z872 Personal history of diseases of the skin and subcutaneous tissue: Secondary | ICD-10-CM | POA: Diagnosis not present

## 2020-12-06 DIAGNOSIS — H4042X1 Glaucoma secondary to eye inflammation, left eye, mild stage: Secondary | ICD-10-CM | POA: Diagnosis not present

## 2020-12-06 DIAGNOSIS — L821 Other seborrheic keratosis: Secondary | ICD-10-CM | POA: Diagnosis not present

## 2020-12-06 DIAGNOSIS — H209 Unspecified iridocyclitis: Secondary | ICD-10-CM | POA: Diagnosis not present

## 2020-12-14 DIAGNOSIS — L603 Nail dystrophy: Secondary | ICD-10-CM | POA: Diagnosis not present

## 2020-12-14 DIAGNOSIS — E1142 Type 2 diabetes mellitus with diabetic polyneuropathy: Secondary | ICD-10-CM | POA: Diagnosis not present

## 2021-01-21 DIAGNOSIS — M79662 Pain in left lower leg: Secondary | ICD-10-CM | POA: Diagnosis not present

## 2021-01-21 DIAGNOSIS — M5432 Sciatica, left side: Secondary | ICD-10-CM | POA: Diagnosis not present

## 2021-01-26 ENCOUNTER — Ambulatory Visit
Admission: EM | Admit: 2021-01-26 | Discharge: 2021-01-26 | Disposition: A | Discharge status: Home / Self Care | Payer: Medicare HMO | Attending: Emergency Medicine | Admitting: Emergency Medicine

## 2021-01-26 ENCOUNTER — Other Ambulatory Visit: Payer: Self-pay

## 2021-01-26 ENCOUNTER — Encounter: Payer: Self-pay | Admitting: Emergency Medicine

## 2021-01-26 DIAGNOSIS — R42 Dizziness and giddiness: Secondary | ICD-10-CM | POA: Diagnosis not present

## 2021-01-26 DIAGNOSIS — R739 Hyperglycemia, unspecified: Secondary | ICD-10-CM | POA: Insufficient documentation

## 2021-01-26 HISTORY — DX: Prediabetes: R73.03

## 2021-01-26 LAB — GLUCOSE, CAPILLARY: Glucose-Capillary: 131 mg/dL — ABNORMAL HIGH (ref 70–99)

## 2021-01-26 NOTE — ED Provider Notes (Signed)
[facility]    CSN: [id number] Arrival date & time: 01/26/21  1101      History   Chief Complaint Chief Complaint  Patient presents with  . Dizziness    HPI Daniel Manning is a 75 y.o. male.   HPI   75 year old male here for evaluation of lightheadedness and "not feeling right".  Patient reports that he developed the symptoms after starting prednisone on 01/21/21 for treatment of sciatic flare.  He reports that he started to feel disoriented and checked his blood sugar which was 280 at the time.  Patient reports that his blood sugars have been running high since being on the prednisone.  Patient finished the prednisone 2 days ago.  He reports that his symptoms come and go and he feels off balance and if he is on a carpeted floor he feels more off balance.  There has been some mild associated nausea and he describes weakness and heaviness in his legs and feet.  Patient denies headache, changes in vision, weakness in his arms, ringing in his ears, fullness in his ears, changes to his hearing, or saddle anesthesia.  Past Medical History:  Diagnosis Date  . Glaucoma   . Pre-diabetes     There are no problems to display for this patient.   Past Surgical History:  Procedure Laterality Date  . EYE SURGERY         Home Medications    Prior to Admission medications   Medication Sig Start Date End Date Taking? Authorizing Provider  donepezil (ARICEPT) 5 MG tablet Take by mouth. 06/13/19 01/26/21 Yes [provider]  Multiple Vitamin (MULTIVITAMIN) tablet Take 1 tablet by mouth daily.   Yes [provider]  pantoprazole (PROTONIX) 40 MG tablet Take 40 mg by mouth 2 (two) times daily.   Yes [provider]  sucralfate (CARAFATE) 1 GM/10ML suspension Take 10 mLs (1 g total) by mouth 4 (four) times daily -  with meals and at bedtime for 7 days. 05/28/20 06/04/20 Yes Laurene Footman B, PA-C  timolol (BETIMOL) 0.5 % ophthalmic solution Place 1 drop  into the left eye 2 (two) times daily.   Yes [provider]  traMADol (ULTRAM) 50 MG tablet Take 50 mg by mouth every 6 (six) hours as needed.   Yes [provider]  valACYclovir (VALTREX) 500 MG tablet Take 500 mg by mouth 2 (two) times daily.    Yes [provider]  brimonidine (ALPHAGAN) 0.2 % ophthalmic solution Place 1 drop into the left eye 2 (two) times daily.    [provider]  Carboxymeth-Glycerin-Polysorb 0.5-1-0.5 % SOLN Apply 1 drop to eye as needed.    [provider]  gabapentin (NEURONTIN) 300 MG capsule Take 1 capsule by mouth at bedtime. 01/13/21   [provider]    Family History Family History  Problem Relation Age of Onset  . Cancer Mother   . Heart attack Father     Social History Social History   Tobacco Use  . Smoking status: Former Research scientist (life sciences)  . Smokeless tobacco: Never Used  Vaping Use  . Vaping Use: Never used  Substance Use Topics  . Alcohol use: No  . Drug use: Not Currently     Allergies   Combigan [brimonidine tartrate-timolol], Lumigan [bimatoprost], Moxeza [moxifloxacin], Pravastatin, and Zirgan [ganciclovir]   Review of Systems Review of Systems  Constitutional: Negative for activity change, appetite change and fever.  HENT: Negative for ear pain and tinnitus.  Gastrointestinal: Positive for nausea.  Neurological: Positive for weakness and light-headedness. Negative for syncope.  Hematological: Negative.   Psychiatric/Behavioral: Negative.      Physical Exam Triage Vital Signs ED Triage Vitals [01/26/21 1142]  Enc Vitals Group     BP      Pulse      Resp      Temp      Temp src      SpO2      Weight 179 lb 14.3 oz (81.6 kg)     Height 5\' 7"  (1.702 m)     Head Circumference      Peak Flow      Pain Score 0     Pain Loc      Pain Edu?      Excl. in [place]?    No data found.  Updated Vital Signs BP (!) 142/77 (BP Location: Left Arm)   Pulse 70   Temp 98.2 F (36.8 C)  (Oral)   Resp 18   Ht 5\' 7"  (1.702 m)   Wt 179 lb 14.3 oz (81.6 kg)   SpO2 97%   BMI 28.18 kg/m   Visual Acuity Right Eye Distance:   Left Eye Distance:   Bilateral Distance:    Right Eye Near:   Left Eye Near:    Bilateral Near:     Physical Exam Vitals and nursing note reviewed.  Constitutional:      General: He is not in acute distress.    Appearance: Normal appearance. He is not ill-appearing.  HENT:     Head: Normocephalic and atraumatic.     Right Ear: Tympanic membrane, ear canal and external ear normal. There is no impacted cerumen.     Left Ear: Tympanic membrane, ear canal and external ear normal.     Nose: Nose normal.     Mouth/Throat:     Mouth: Mucous membranes are moist.     Pharynx: Oropharynx is clear. No posterior oropharyngeal erythema.  Eyes:     General: No scleral icterus.    Extraocular Movements: Extraocular movements intact.     Conjunctiva/sclera: Conjunctivae normal.     Pupils: Pupils are equal, round, and reactive to light.  Cardiovascular:     Rate and Rhythm: Normal rate and regular rhythm.     Pulses: Normal pulses.     Heart sounds: Normal heart sounds. No murmur heard. No gallop.   Pulmonary:     Effort: Pulmonary effort is normal.     Breath sounds: Normal breath sounds. No wheezing, rhonchi or rales.  Musculoskeletal:        General: Normal range of motion.     Cervical back: Normal range of motion and neck supple.  Lymphadenopathy:     Cervical: No cervical adenopathy.  Skin:    General: Skin is warm and dry.     Capillary Refill: Capillary refill takes less than 2 seconds.     Findings: No erythema or rash.  Neurological:     General: No focal deficit present.     Mental Status: He is alert and oriented to person, place, and time.     Cranial Nerves: No cranial nerve deficit.     Motor: No weakness.     Coordination: Coordination normal.     Deep Tendon Reflexes: Reflexes normal.  Psychiatric:        Mood and Affect:  Mood normal.        Behavior: Behavior normal.  Thought Content: Thought content normal.        Judgment: Judgment normal.      UC Treatments / Results  Labs (all labs ordered are listed, but only abnormal results are displayed) Labs Reviewed  GLUCOSE, CAPILLARY - Abnormal; Notable for the following components:      Result Value   Glucose-Capillary 131 (*)    All other components within normal limits    EKG   Radiology No results found.  Procedures Procedures (including critical care time)  Medications Ordered in UC Medications - No data to display  Initial Impression / Assessment and Plan / UC Course  I have reviewed the triage vital signs and the nursing notes.  Pertinent labs & imaging results that were available during my care of the patient were reviewed by me and considered in my medical decision making (see chart for details).   Patient is a very pleasant 75 year old male here for evaluation of what he describes as lightheadedness and feeling disoriented.  He reports this symptom started when he can take prednisone for sciatica treatment on 01/22/2020.  His symptoms will come and go.  He describes it as feeling off balance but not vertigo, which has had in the past, and mild nausea.  He reports the symptoms are different than his vertigo.  He also describes weakness and heaviness in his legs and feet.  Patient reports that when he started the prednisone effect of his neuropathy in his legs and feet and because severe burning.  He states that it is on carpeted surfaces he feels more off balance.  Patient denies any headache, visual changes, weakness of her legs, his ears, ear fullness, changes to his hearing, or saddle anesthesia.  Patient's physical exam reveals cranial nerves II through XII intact.  Pupils round reactive light.  Tympanic membranes are pearly gray bilaterally with a normal light reflex and clear external auditory canals.  Cardiopulmonary exam is benign.   No cervical lymphadenopathy appreciated exam.  Bilateral grips and upper extremity strength is 5/5 and lower extremity strength bilaterally is 5/5.  DTRs are 2+ globally.  Patient reports that his fasting blood sugars are around 1 10-1 40 and have been for a long time.  His last hemoglobin A1c was on 11/10/2020 and was 6.4 at that time.  Patient's fingerstick blood sugar here in clinic is 131.  Patient last ate at 8 AM, 4 hours ago, and he had sausage, eggs, and cheese.  Patient's blood sugar still slightly elevated considering he is four hours postprandial.  There is no acute findings on patient's exam and suspect that his symptoms are coming from his elevated blood sugar.  I have directed the patient to follow-up with his primary care doctor, Dr. Ellison Hughs at [facility] to discuss possibly starting metformin or another medication for his elevated blood sugar, especially given his existing peripheral neuropathy.   Final Clinical Impressions(s) / UC Diagnoses   Final diagnoses:  Dizziness  Hyperglycemia     Discharge Instructions     Follow-up with your primary care doctor, Dr. Ellison Hughs, and to discuss your ongoing symptoms as I do believe they are associated with your elevated blood sugar.  It may be time for you to start medication to help lower your blood sugar even though your hemoglobin A1c has been less than 7.    ED Prescriptions    None     PDMP not reviewed this encounter.   Margarette Canada, NP 01/26/21 1222

## 2021-01-26 NOTE — Discharge Instructions (Addendum)
Follow-up with your primary care doctor, Dr. Ellison Hughs, and to discuss your ongoing symptoms as I do believe they are associated with your elevated blood sugar.  It may be time for you to start medication to help lower your blood sugar even though your hemoglobin A1c has been less than 7.

## 2021-01-26 NOTE — ED Triage Notes (Signed)
Pt c/o lightheadedness and not feeling right. He states he was given prednisone and after starting it he feels disoriented. He states he checked his blood sugar after starting the prednisone and was 280. This morning it was 135.

## 2021-02-01 ENCOUNTER — Ambulatory Visit
Admission: EM | Admit: 2021-02-01 | Discharge: 2021-02-01 | Disposition: A | Discharge status: Home / Self Care | Payer: Medicare HMO | Attending: Emergency Medicine | Admitting: Emergency Medicine

## 2021-02-01 ENCOUNTER — Encounter: Payer: Self-pay | Admitting: Emergency Medicine

## 2021-02-01 ENCOUNTER — Other Ambulatory Visit: Payer: Self-pay

## 2021-02-01 DIAGNOSIS — U071 COVID-19: Secondary | ICD-10-CM | POA: Diagnosis not present

## 2021-02-01 MED ORDER — NIRMATRELVIR/RITONAVIR (PAXLOVID)TABLET: 3.0000 | ORAL_TABLET | Freq: Two times a day (BID) | ORAL | 0 refills | Status: AC

## 2021-02-01 NOTE — Discharge Instructions (Addendum)
Take the Paxlovid twice daily for 5 days for treatment of COVID-19.  Use Tylenol according to the package instructions as needed for fever.  Use over-the-counter Delsym or Robitussin-DM as needed for cough.  If you develop any worsening respiratory complaints, especially shortness of breath, shortness of breath at rest, an inability to catch her breath, and inability speak in full sentences, or late sign your lip start turning blue you need to go to the ER for evaluation.

## 2021-02-01 NOTE — ED Triage Notes (Signed)
Pt c/o headache, fever, nasal congestion, cough, chills, sore throat, chest tightness and body aches for several days. Pt took an at-home COVID test yesterday and it was positive.

## 2021-02-01 NOTE — ED Provider Notes (Signed)
[facility]    CSN: [id number] Arrival date & time: 02/01/21  1408      History   Chief Complaint Chief Complaint  Patient presents with  . Headache  . Generalized Body Aches    HPI Daniel Manning is a 75 y.o. male.   HPI   75 year old male here for evaluation of respiratory symptoms.  Patient reports that he has had a headache, fever with a T-max of 101.3, nasal congestion, nonproductive cough, chills, sore throat, chest tightness, and body aches since yesterday.  He reports he took a COVID test at home and he was positive.  Patient is here for confirmatory test and to get the oral antivirals.    Past Medical History:  Diagnosis Date  . Glaucoma   . Pre-diabetes     There are no problems to display for this patient.   Past Surgical History:  Procedure Laterality Date  . EYE SURGERY         Home Medications    Prior to Admission medications   Medication Sig Start Date End Date Taking? Authorizing Provider  brimonidine (ALPHAGAN) 0.2 % ophthalmic solution Place 1 drop into the left eye 2 (two) times daily.   Yes [provider]  Carboxymeth-Glycerin-Polysorb 0.5-1-0.5 % SOLN Apply 1 drop to eye as needed.   Yes [provider]  donepezil (ARICEPT) 5 MG tablet Take by mouth. 06/13/19 02/01/21 Yes [provider]  gabapentin (NEURONTIN) 300 MG capsule Take 1 capsule by mouth at bedtime. 01/13/21  Yes [provider]  Multiple Vitamin (MULTIVITAMIN) tablet Take 1 tablet by mouth daily.   Yes [provider]  nirmatrelvir/ritonavir EUA (PAXLOVID) TABS Take 3 tablets by mouth 2 (two) times daily for 5 days. Patient GFR is 94. Take nirmatrelvir (150 mg) two tablets twice daily for 5 days and ritonavir (100 mg) one tablet twice daily for 5 days. 02/01/21 02/06/21 Yes Margarette Canada, NP  pantoprazole (PROTONIX) 40 MG tablet Take 40 mg by mouth 2 (two) times daily.   Yes [provider]  timolol (BETIMOL) 0.5 %  ophthalmic solution Place 1 drop into the left eye 2 (two) times daily.   Yes [provider]  traMADol (ULTRAM) 50 MG tablet Take 50 mg by mouth every 6 (six) hours as needed.   Yes [provider]  valACYclovir (VALTREX) 500 MG tablet Take 500 mg by mouth 2 (two) times daily.    Yes [provider]  sucralfate (CARAFATE) 1 GM/10ML suspension Take 10 mLs (1 g total) by mouth 4 (four) times daily -  with meals and at bedtime for 7 days. 05/28/20 06/04/20  Danton Clap, PA-C    Family History Family History  Problem Relation Age of Onset  . Cancer Mother   . Heart attack Father     Social History Social History   Tobacco Use  . Smoking status: Former Research scientist (life sciences)  . Smokeless tobacco: Never Used  Vaping Use  . Vaping Use: Never used  Substance Use Topics  . Alcohol use: No  . Drug use: Not Currently     Allergies   Combigan [brimonidine tartrate-timolol], Lumigan [bimatoprost], Moxeza [moxifloxacin], Pravastatin, and Zirgan [ganciclovir]   Review of Systems Review of Systems  Constitutional: Positive for chills and fever. Negative for activity change and appetite change.  HENT: Positive for congestion and sore throat.   Musculoskeletal: Positive for arthralgias and myalgias.  Skin: Negative for rash.  Neurological: Positive for headaches.     Physical  Exam Triage Vital Signs ED Triage Vitals  Enc Vitals Group     BP      Pulse      Resp      Temp      Temp src      SpO2      Weight      Height      Head Circumference      Peak Flow      Pain Score      Pain Loc      Pain Edu?      Excl. in [place]?    No data found.  Updated Vital Signs BP (!) 171/71 (BP Location: Left Arm)   Pulse 67   Temp 99.9 F (37.7 C) (Oral)   Resp 18   Ht 5\' 7"  (1.702 m)   Wt 185 lb (83.9 kg)   SpO2 96%   BMI 28.98 kg/m   Visual Acuity Right Eye Distance:   Left Eye Distance:   Bilateral Distance:    Right Eye Near:   Left Eye Near:    Bilateral  Near:     Physical Exam Vitals and nursing note reviewed.  Constitutional:      General: He is not in acute distress.    Appearance: Normal appearance. He is normal weight. He is not ill-appearing.  HENT:     Head: Normocephalic and atraumatic.     Nose: Congestion and rhinorrhea present.     Mouth/Throat:     Mouth: Mucous membranes are moist.     Pharynx: Oropharynx is clear. Posterior oropharyngeal erythema present.  Cardiovascular:     Rate and Rhythm: Normal rate and regular rhythm.     Pulses: Normal pulses.     Heart sounds: Normal heart sounds. No murmur heard. No gallop.   Pulmonary:     Effort: Pulmonary effort is normal.     Breath sounds: Normal breath sounds. No wheezing, rhonchi or rales.  Musculoskeletal:     Cervical back: Normal range of motion and neck supple.  Lymphadenopathy:     Cervical: No cervical adenopathy.  Skin:    General: Skin is warm and dry.     Capillary Refill: Capillary refill takes less than 2 seconds.     Findings: No erythema or rash.  Neurological:     General: No focal deficit present.     Mental Status: He is alert and oriented to person, place, and time.  Psychiatric:        Mood and Affect: Mood normal.        Behavior: Behavior normal.        Thought Content: Thought content normal.        Judgment: Judgment normal.      UC Treatments / Results  Labs (all labs ordered are listed, but only abnormal results are displayed) Labs Reviewed  SARS CORONAVIRUS 2 (TAT 6-24 HRS)    EKG   Radiology No results found.  Procedures Procedures (including critical care time)  Medications Ordered in UC Medications - No data to display  Initial Impression / Assessment and Plan / UC Course  I have reviewed the triage vital signs and the nursing notes.  Pertinent labs & imaging results that were available during my care of the patient were reviewed by me and considered in my medical decision making (see chart for details).   Very  pleasant, nontoxic-appearing 75 year old male here for evaluation of respiratory symptoms as listed in the HPI.  He reports  that his symptoms began yesterday and at that time he took a home COVID test which was positive.  He is here today for a confirmatory test and to discuss oral antivirals.  Physical exam reveals erythematous nasal mucosa with edema and clear nasal discharge.  Oropharyngeal exam reveals posterior oropharyngeal erythema with clear postnasal drip.  No cervical lymphadenopathy present on exam.  Cardiopulmonary exam is benign.  With patient having a positive home COVID test we will prescribe oral antiviral therapy based on his risk factors of being elderly, prediabetic, and with a history of glaucoma.Will prescribe Paxlovid twice daily for 5 days.   Final Clinical Impressions(s) / UC Diagnoses   Final diagnoses:  [id number]     Discharge Instructions     Take the Paxlovid twice daily for 5 days for treatment of COVID-19.  Use Tylenol according to the package instructions as needed for fever.  Use over-the-counter Delsym or Robitussin-DM as needed for cough.  If you develop any worsening respiratory complaints, especially shortness of breath, shortness of breath at rest, an inability to catch her breath, and inability speak in full sentences, or late sign your lip start turning blue you need to go to the ER for evaluation.    ED Prescriptions    Medication Sig Dispense Auth. Provider   nirmatrelvir/ritonavir EUA (PAXLOVID) TABS Take 3 tablets by mouth 2 (two) times daily for 5 days. Patient GFR is 94. Take nirmatrelvir (150 mg) two tablets twice daily for 5 days and ritonavir (100 mg) one tablet twice daily for 5 days. 30 tablet Margarette Canada, NP     PDMP not reviewed this encounter.   Margarette Canada, NP 02/01/21 [place]

## 2021-02-02 LAB — SARS CORONAVIRUS 2 (TAT 6-24 HRS): SARS Coronavirus 2: POSITIVE — AB

## 2021-03-15 DIAGNOSIS — L603 Nail dystrophy: Secondary | ICD-10-CM | POA: Diagnosis not present

## 2021-03-15 DIAGNOSIS — E1142 Type 2 diabetes mellitus with diabetic polyneuropathy: Secondary | ICD-10-CM | POA: Diagnosis not present

## 2021-03-20 DIAGNOSIS — E118 Type 2 diabetes mellitus with unspecified complications: Secondary | ICD-10-CM | POA: Diagnosis not present

## 2021-03-20 DIAGNOSIS — G4733 Obstructive sleep apnea (adult) (pediatric): Secondary | ICD-10-CM | POA: Diagnosis not present

## 2021-03-20 DIAGNOSIS — K219 Gastro-esophageal reflux disease without esophagitis: Secondary | ICD-10-CM | POA: Diagnosis not present

## 2021-03-20 DIAGNOSIS — R079 Chest pain, unspecified: Secondary | ICD-10-CM | POA: Diagnosis not present

## 2021-03-20 DIAGNOSIS — R001 Bradycardia, unspecified: Secondary | ICD-10-CM | POA: Diagnosis not present

## 2021-03-20 DIAGNOSIS — Z8616 Personal history of COVID-19: Secondary | ICD-10-CM | POA: Diagnosis not present

## 2021-03-20 DIAGNOSIS — R9431 Abnormal electrocardiogram [ECG] [EKG]: Secondary | ICD-10-CM | POA: Diagnosis not present

## 2021-03-20 DIAGNOSIS — I10 Essential (primary) hypertension: Secondary | ICD-10-CM | POA: Diagnosis not present

## 2021-03-20 DIAGNOSIS — R0789 Other chest pain: Secondary | ICD-10-CM | POA: Diagnosis not present

## 2021-03-20 DIAGNOSIS — U071 COVID-19: Secondary | ICD-10-CM | POA: Diagnosis not present

## 2021-04-04 DIAGNOSIS — H4042X1 Glaucoma secondary to eye inflammation, left eye, mild stage: Secondary | ICD-10-CM | POA: Diagnosis not present

## 2021-04-04 DIAGNOSIS — H4089 Other specified glaucoma: Secondary | ICD-10-CM | POA: Diagnosis not present

## 2021-04-04 DIAGNOSIS — H209 Unspecified iridocyclitis: Secondary | ICD-10-CM | POA: Diagnosis not present

## 2021-05-26 DIAGNOSIS — M791 Myalgia, unspecified site: Secondary | ICD-10-CM | POA: Diagnosis not present

## 2021-05-26 DIAGNOSIS — Z125 Encounter for screening for malignant neoplasm of prostate: Secondary | ICD-10-CM | POA: Diagnosis not present

## 2021-05-26 DIAGNOSIS — N401 Enlarged prostate with lower urinary tract symptoms: Secondary | ICD-10-CM | POA: Diagnosis not present

## 2021-05-26 DIAGNOSIS — G4733 Obstructive sleep apnea (adult) (pediatric): Secondary | ICD-10-CM | POA: Diagnosis not present

## 2021-05-26 DIAGNOSIS — R413 Other amnesia: Secondary | ICD-10-CM | POA: Diagnosis not present

## 2021-05-26 DIAGNOSIS — M5442 Lumbago with sciatica, left side: Secondary | ICD-10-CM | POA: Diagnosis not present

## 2021-05-26 DIAGNOSIS — K219 Gastro-esophageal reflux disease without esophagitis: Secondary | ICD-10-CM | POA: Diagnosis not present

## 2021-05-26 DIAGNOSIS — E1142 Type 2 diabetes mellitus with diabetic polyneuropathy: Secondary | ICD-10-CM | POA: Diagnosis not present

## 2021-05-26 DIAGNOSIS — E7800 Pure hypercholesterolemia, unspecified: Secondary | ICD-10-CM | POA: Diagnosis not present

## 2021-06-01 DIAGNOSIS — R200 Anesthesia of skin: Secondary | ICD-10-CM | POA: Diagnosis not present

## 2021-06-01 DIAGNOSIS — B0052 Herpesviral keratitis: Secondary | ICD-10-CM | POA: Diagnosis not present

## 2021-06-01 DIAGNOSIS — G4730 Sleep apnea, unspecified: Secondary | ICD-10-CM | POA: Diagnosis not present

## 2021-06-01 DIAGNOSIS — R202 Paresthesia of skin: Secondary | ICD-10-CM | POA: Diagnosis not present

## 2021-06-01 DIAGNOSIS — B259 Cytomegaloviral disease, unspecified: Secondary | ICD-10-CM | POA: Diagnosis not present

## 2021-06-01 DIAGNOSIS — G510 Bell's palsy: Secondary | ICD-10-CM | POA: Diagnosis not present

## 2021-06-01 DIAGNOSIS — R29818 Other symptoms and signs involving the nervous system: Secondary | ICD-10-CM | POA: Diagnosis not present

## 2021-06-01 DIAGNOSIS — E1142 Type 2 diabetes mellitus with diabetic polyneuropathy: Secondary | ICD-10-CM | POA: Diagnosis not present

## 2021-06-01 DIAGNOSIS — E785 Hyperlipidemia, unspecified: Secondary | ICD-10-CM | POA: Diagnosis not present

## 2021-06-01 DIAGNOSIS — R001 Bradycardia, unspecified: Secondary | ICD-10-CM | POA: Diagnosis not present

## 2021-06-01 DIAGNOSIS — H3092 Unspecified chorioretinal inflammation, left eye: Secondary | ICD-10-CM | POA: Diagnosis not present

## 2021-06-01 DIAGNOSIS — R2990 Unspecified symptoms and signs involving the nervous system: Secondary | ICD-10-CM | POA: Diagnosis not present

## 2021-06-01 DIAGNOSIS — H209 Unspecified iridocyclitis: Secondary | ICD-10-CM | POA: Diagnosis not present

## 2021-06-01 DIAGNOSIS — B0050 Herpesviral ocular disease, unspecified: Secondary | ICD-10-CM | POA: Diagnosis not present

## 2021-06-01 DIAGNOSIS — H5789 Other specified disorders of eye and adnexa: Secondary | ICD-10-CM | POA: Diagnosis not present

## 2021-06-06 DIAGNOSIS — H209 Unspecified iridocyclitis: Secondary | ICD-10-CM | POA: Diagnosis not present

## 2021-06-06 DIAGNOSIS — H4042X1 Glaucoma secondary to eye inflammation, left eye, mild stage: Secondary | ICD-10-CM | POA: Diagnosis not present

## 2021-06-23 DIAGNOSIS — E1142 Type 2 diabetes mellitus with diabetic polyneuropathy: Secondary | ICD-10-CM | POA: Diagnosis not present

## 2021-06-23 DIAGNOSIS — L603 Nail dystrophy: Secondary | ICD-10-CM | POA: Diagnosis not present

## 2021-07-22 DIAGNOSIS — M5416 Radiculopathy, lumbar region: Secondary | ICD-10-CM | POA: Diagnosis not present

## 2021-07-25 DIAGNOSIS — L308 Other specified dermatitis: Secondary | ICD-10-CM | POA: Diagnosis not present

## 2021-08-02 DIAGNOSIS — M5432 Sciatica, left side: Secondary | ICD-10-CM | POA: Diagnosis not present

## 2021-08-02 DIAGNOSIS — M9903 Segmental and somatic dysfunction of lumbar region: Secondary | ICD-10-CM | POA: Diagnosis not present

## 2021-08-02 DIAGNOSIS — M5136 Other intervertebral disc degeneration, lumbar region: Secondary | ICD-10-CM | POA: Diagnosis not present

## 2021-08-02 DIAGNOSIS — M5416 Radiculopathy, lumbar region: Secondary | ICD-10-CM | POA: Diagnosis not present

## 2021-08-04 DIAGNOSIS — M9903 Segmental and somatic dysfunction of lumbar region: Secondary | ICD-10-CM | POA: Diagnosis not present

## 2021-08-04 DIAGNOSIS — M5136 Other intervertebral disc degeneration, lumbar region: Secondary | ICD-10-CM | POA: Diagnosis not present

## 2021-08-04 DIAGNOSIS — M5416 Radiculopathy, lumbar region: Secondary | ICD-10-CM | POA: Diagnosis not present

## 2021-08-04 DIAGNOSIS — M5432 Sciatica, left side: Secondary | ICD-10-CM | POA: Diagnosis not present

## 2021-08-06 DIAGNOSIS — M5136 Other intervertebral disc degeneration, lumbar region: Secondary | ICD-10-CM | POA: Diagnosis not present

## 2021-08-06 DIAGNOSIS — M9903 Segmental and somatic dysfunction of lumbar region: Secondary | ICD-10-CM | POA: Diagnosis not present

## 2021-08-06 DIAGNOSIS — M5432 Sciatica, left side: Secondary | ICD-10-CM | POA: Diagnosis not present

## 2021-08-06 DIAGNOSIS — M5416 Radiculopathy, lumbar region: Secondary | ICD-10-CM | POA: Diagnosis not present

## 2021-08-08 DIAGNOSIS — M5416 Radiculopathy, lumbar region: Secondary | ICD-10-CM | POA: Diagnosis not present

## 2021-08-08 DIAGNOSIS — M9903 Segmental and somatic dysfunction of lumbar region: Secondary | ICD-10-CM | POA: Diagnosis not present

## 2021-08-08 DIAGNOSIS — M5432 Sciatica, left side: Secondary | ICD-10-CM | POA: Diagnosis not present

## 2021-08-08 DIAGNOSIS — M5136 Other intervertebral disc degeneration, lumbar region: Secondary | ICD-10-CM | POA: Diagnosis not present

## 2021-08-10 DIAGNOSIS — M5416 Radiculopathy, lumbar region: Secondary | ICD-10-CM | POA: Diagnosis not present

## 2021-08-10 DIAGNOSIS — M5136 Other intervertebral disc degeneration, lumbar region: Secondary | ICD-10-CM | POA: Diagnosis not present

## 2021-08-10 DIAGNOSIS — M9903 Segmental and somatic dysfunction of lumbar region: Secondary | ICD-10-CM | POA: Diagnosis not present

## 2021-08-10 DIAGNOSIS — M5432 Sciatica, left side: Secondary | ICD-10-CM | POA: Diagnosis not present

## 2021-08-11 DIAGNOSIS — M5416 Radiculopathy, lumbar region: Secondary | ICD-10-CM | POA: Diagnosis not present

## 2021-08-11 DIAGNOSIS — M5432 Sciatica, left side: Secondary | ICD-10-CM | POA: Diagnosis not present

## 2021-08-11 DIAGNOSIS — M9903 Segmental and somatic dysfunction of lumbar region: Secondary | ICD-10-CM | POA: Diagnosis not present

## 2021-08-11 DIAGNOSIS — M5136 Other intervertebral disc degeneration, lumbar region: Secondary | ICD-10-CM | POA: Diagnosis not present

## 2021-08-15 DIAGNOSIS — M5416 Radiculopathy, lumbar region: Secondary | ICD-10-CM | POA: Diagnosis not present

## 2021-08-15 DIAGNOSIS — M9903 Segmental and somatic dysfunction of lumbar region: Secondary | ICD-10-CM | POA: Diagnosis not present

## 2021-08-15 DIAGNOSIS — M5432 Sciatica, left side: Secondary | ICD-10-CM | POA: Diagnosis not present

## 2021-08-15 DIAGNOSIS — H4042X1 Glaucoma secondary to eye inflammation, left eye, mild stage: Secondary | ICD-10-CM | POA: Diagnosis not present

## 2021-08-15 DIAGNOSIS — M5136 Other intervertebral disc degeneration, lumbar region: Secondary | ICD-10-CM | POA: Diagnosis not present

## 2021-08-15 DIAGNOSIS — H209 Unspecified iridocyclitis: Secondary | ICD-10-CM | POA: Diagnosis not present

## 2021-08-17 DIAGNOSIS — M5416 Radiculopathy, lumbar region: Secondary | ICD-10-CM | POA: Diagnosis not present

## 2021-08-17 DIAGNOSIS — M5432 Sciatica, left side: Secondary | ICD-10-CM | POA: Diagnosis not present

## 2021-08-17 DIAGNOSIS — M5136 Other intervertebral disc degeneration, lumbar region: Secondary | ICD-10-CM | POA: Diagnosis not present

## 2021-08-17 DIAGNOSIS — M9903 Segmental and somatic dysfunction of lumbar region: Secondary | ICD-10-CM | POA: Diagnosis not present

## 2021-08-20 DIAGNOSIS — M9903 Segmental and somatic dysfunction of lumbar region: Secondary | ICD-10-CM | POA: Diagnosis not present

## 2021-08-20 DIAGNOSIS — M5416 Radiculopathy, lumbar region: Secondary | ICD-10-CM | POA: Diagnosis not present

## 2021-08-20 DIAGNOSIS — M5136 Other intervertebral disc degeneration, lumbar region: Secondary | ICD-10-CM | POA: Diagnosis not present

## 2021-08-20 DIAGNOSIS — M5432 Sciatica, left side: Secondary | ICD-10-CM | POA: Diagnosis not present

## 2021-08-22 DIAGNOSIS — M5432 Sciatica, left side: Secondary | ICD-10-CM | POA: Diagnosis not present

## 2021-08-22 DIAGNOSIS — M9903 Segmental and somatic dysfunction of lumbar region: Secondary | ICD-10-CM | POA: Diagnosis not present

## 2021-08-22 DIAGNOSIS — M5136 Other intervertebral disc degeneration, lumbar region: Secondary | ICD-10-CM | POA: Diagnosis not present

## 2021-08-22 DIAGNOSIS — M5416 Radiculopathy, lumbar region: Secondary | ICD-10-CM | POA: Diagnosis not present

## 2021-08-24 DIAGNOSIS — M5432 Sciatica, left side: Secondary | ICD-10-CM | POA: Diagnosis not present

## 2021-08-24 DIAGNOSIS — M9903 Segmental and somatic dysfunction of lumbar region: Secondary | ICD-10-CM | POA: Diagnosis not present

## 2021-08-24 DIAGNOSIS — M5416 Radiculopathy, lumbar region: Secondary | ICD-10-CM | POA: Diagnosis not present

## 2021-08-24 DIAGNOSIS — M5136 Other intervertebral disc degeneration, lumbar region: Secondary | ICD-10-CM | POA: Diagnosis not present

## 2021-08-25 DIAGNOSIS — M5136 Other intervertebral disc degeneration, lumbar region: Secondary | ICD-10-CM | POA: Diagnosis not present

## 2021-08-25 DIAGNOSIS — M9903 Segmental and somatic dysfunction of lumbar region: Secondary | ICD-10-CM | POA: Diagnosis not present

## 2021-08-25 DIAGNOSIS — M5416 Radiculopathy, lumbar region: Secondary | ICD-10-CM | POA: Diagnosis not present

## 2021-08-25 DIAGNOSIS — M5432 Sciatica, left side: Secondary | ICD-10-CM | POA: Diagnosis not present

## 2021-08-30 DIAGNOSIS — M9903 Segmental and somatic dysfunction of lumbar region: Secondary | ICD-10-CM | POA: Diagnosis not present

## 2021-08-30 DIAGNOSIS — M5416 Radiculopathy, lumbar region: Secondary | ICD-10-CM | POA: Diagnosis not present

## 2021-08-30 DIAGNOSIS — M5432 Sciatica, left side: Secondary | ICD-10-CM | POA: Diagnosis not present

## 2021-08-30 DIAGNOSIS — M5136 Other intervertebral disc degeneration, lumbar region: Secondary | ICD-10-CM | POA: Diagnosis not present

## 2021-09-01 DIAGNOSIS — M9903 Segmental and somatic dysfunction of lumbar region: Secondary | ICD-10-CM | POA: Diagnosis not present

## 2021-09-01 DIAGNOSIS — M5136 Other intervertebral disc degeneration, lumbar region: Secondary | ICD-10-CM | POA: Diagnosis not present

## 2021-09-01 DIAGNOSIS — M5432 Sciatica, left side: Secondary | ICD-10-CM | POA: Diagnosis not present

## 2021-09-01 DIAGNOSIS — M5416 Radiculopathy, lumbar region: Secondary | ICD-10-CM | POA: Diagnosis not present

## 2021-09-02 DIAGNOSIS — M5416 Radiculopathy, lumbar region: Secondary | ICD-10-CM | POA: Diagnosis not present

## 2021-09-02 DIAGNOSIS — M5432 Sciatica, left side: Secondary | ICD-10-CM | POA: Diagnosis not present

## 2021-09-02 DIAGNOSIS — M5136 Other intervertebral disc degeneration, lumbar region: Secondary | ICD-10-CM | POA: Diagnosis not present

## 2021-09-02 DIAGNOSIS — M9903 Segmental and somatic dysfunction of lumbar region: Secondary | ICD-10-CM | POA: Diagnosis not present

## 2021-09-05 DIAGNOSIS — M5416 Radiculopathy, lumbar region: Secondary | ICD-10-CM | POA: Diagnosis not present

## 2021-09-05 DIAGNOSIS — M9903 Segmental and somatic dysfunction of lumbar region: Secondary | ICD-10-CM | POA: Diagnosis not present

## 2021-09-05 DIAGNOSIS — M5136 Other intervertebral disc degeneration, lumbar region: Secondary | ICD-10-CM | POA: Diagnosis not present

## 2021-09-05 DIAGNOSIS — M5432 Sciatica, left side: Secondary | ICD-10-CM | POA: Diagnosis not present

## 2021-09-07 DIAGNOSIS — M5432 Sciatica, left side: Secondary | ICD-10-CM | POA: Diagnosis not present

## 2021-09-07 DIAGNOSIS — M5136 Other intervertebral disc degeneration, lumbar region: Secondary | ICD-10-CM | POA: Diagnosis not present

## 2021-09-07 DIAGNOSIS — M9903 Segmental and somatic dysfunction of lumbar region: Secondary | ICD-10-CM | POA: Diagnosis not present

## 2021-09-07 DIAGNOSIS — M5416 Radiculopathy, lumbar region: Secondary | ICD-10-CM | POA: Diagnosis not present

## 2021-09-08 DIAGNOSIS — M5432 Sciatica, left side: Secondary | ICD-10-CM | POA: Diagnosis not present

## 2021-09-08 DIAGNOSIS — M5416 Radiculopathy, lumbar region: Secondary | ICD-10-CM | POA: Diagnosis not present

## 2021-09-08 DIAGNOSIS — M5136 Other intervertebral disc degeneration, lumbar region: Secondary | ICD-10-CM | POA: Diagnosis not present

## 2021-09-08 DIAGNOSIS — M9903 Segmental and somatic dysfunction of lumbar region: Secondary | ICD-10-CM | POA: Diagnosis not present

## 2021-09-13 DIAGNOSIS — M9903 Segmental and somatic dysfunction of lumbar region: Secondary | ICD-10-CM | POA: Diagnosis not present

## 2021-09-13 DIAGNOSIS — M5416 Radiculopathy, lumbar region: Secondary | ICD-10-CM | POA: Diagnosis not present

## 2021-09-13 DIAGNOSIS — M5136 Other intervertebral disc degeneration, lumbar region: Secondary | ICD-10-CM | POA: Diagnosis not present

## 2021-09-13 DIAGNOSIS — M5432 Sciatica, left side: Secondary | ICD-10-CM | POA: Diagnosis not present

## 2021-09-16 DIAGNOSIS — M5416 Radiculopathy, lumbar region: Secondary | ICD-10-CM | POA: Diagnosis not present

## 2021-09-16 DIAGNOSIS — M5432 Sciatica, left side: Secondary | ICD-10-CM | POA: Diagnosis not present

## 2021-09-16 DIAGNOSIS — M9903 Segmental and somatic dysfunction of lumbar region: Secondary | ICD-10-CM | POA: Diagnosis not present

## 2021-09-16 DIAGNOSIS — M5136 Other intervertebral disc degeneration, lumbar region: Secondary | ICD-10-CM | POA: Diagnosis not present

## 2021-09-19 DIAGNOSIS — M9903 Segmental and somatic dysfunction of lumbar region: Secondary | ICD-10-CM | POA: Diagnosis not present

## 2021-09-19 DIAGNOSIS — M5416 Radiculopathy, lumbar region: Secondary | ICD-10-CM | POA: Diagnosis not present

## 2021-09-19 DIAGNOSIS — M5136 Other intervertebral disc degeneration, lumbar region: Secondary | ICD-10-CM | POA: Diagnosis not present

## 2021-09-19 DIAGNOSIS — M5432 Sciatica, left side: Secondary | ICD-10-CM | POA: Diagnosis not present

## 2021-09-23 DIAGNOSIS — M5416 Radiculopathy, lumbar region: Secondary | ICD-10-CM | POA: Diagnosis not present

## 2021-09-24 DIAGNOSIS — M5432 Sciatica, left side: Secondary | ICD-10-CM | POA: Diagnosis not present

## 2021-09-24 DIAGNOSIS — M5136 Other intervertebral disc degeneration, lumbar region: Secondary | ICD-10-CM | POA: Diagnosis not present

## 2021-09-24 DIAGNOSIS — M9903 Segmental and somatic dysfunction of lumbar region: Secondary | ICD-10-CM | POA: Diagnosis not present

## 2021-09-24 DIAGNOSIS — M5416 Radiculopathy, lumbar region: Secondary | ICD-10-CM | POA: Diagnosis not present

## 2021-09-30 DIAGNOSIS — M5432 Sciatica, left side: Secondary | ICD-10-CM | POA: Diagnosis not present

## 2021-09-30 DIAGNOSIS — M5416 Radiculopathy, lumbar region: Secondary | ICD-10-CM | POA: Diagnosis not present

## 2021-09-30 DIAGNOSIS — M5136 Other intervertebral disc degeneration, lumbar region: Secondary | ICD-10-CM | POA: Diagnosis not present

## 2021-09-30 DIAGNOSIS — M9903 Segmental and somatic dysfunction of lumbar region: Secondary | ICD-10-CM | POA: Diagnosis not present

## 2021-10-05 DIAGNOSIS — M5136 Other intervertebral disc degeneration, lumbar region: Secondary | ICD-10-CM | POA: Diagnosis not present

## 2021-10-05 DIAGNOSIS — G8929 Other chronic pain: Secondary | ICD-10-CM | POA: Diagnosis not present

## 2021-10-05 DIAGNOSIS — M19049 Primary osteoarthritis, unspecified hand: Secondary | ICD-10-CM | POA: Diagnosis not present

## 2021-10-05 DIAGNOSIS — M5416 Radiculopathy, lumbar region: Secondary | ICD-10-CM | POA: Diagnosis not present

## 2021-10-05 DIAGNOSIS — E7800 Pure hypercholesterolemia, unspecified: Secondary | ICD-10-CM | POA: Diagnosis not present

## 2021-10-05 DIAGNOSIS — M5432 Sciatica, left side: Secondary | ICD-10-CM | POA: Diagnosis not present

## 2021-10-05 DIAGNOSIS — M9903 Segmental and somatic dysfunction of lumbar region: Secondary | ICD-10-CM | POA: Diagnosis not present

## 2021-10-05 DIAGNOSIS — M5442 Lumbago with sciatica, left side: Secondary | ICD-10-CM | POA: Diagnosis not present

## 2021-10-08 DIAGNOSIS — M5432 Sciatica, left side: Secondary | ICD-10-CM | POA: Diagnosis not present

## 2021-10-08 DIAGNOSIS — M5136 Other intervertebral disc degeneration, lumbar region: Secondary | ICD-10-CM | POA: Diagnosis not present

## 2021-10-08 DIAGNOSIS — M5416 Radiculopathy, lumbar region: Secondary | ICD-10-CM | POA: Diagnosis not present

## 2021-10-08 DIAGNOSIS — M9903 Segmental and somatic dysfunction of lumbar region: Secondary | ICD-10-CM | POA: Diagnosis not present

## 2021-10-12 DIAGNOSIS — M5416 Radiculopathy, lumbar region: Secondary | ICD-10-CM | POA: Diagnosis not present

## 2021-10-12 DIAGNOSIS — M5136 Other intervertebral disc degeneration, lumbar region: Secondary | ICD-10-CM | POA: Diagnosis not present

## 2021-10-12 DIAGNOSIS — M9903 Segmental and somatic dysfunction of lumbar region: Secondary | ICD-10-CM | POA: Diagnosis not present

## 2021-10-12 DIAGNOSIS — M5432 Sciatica, left side: Secondary | ICD-10-CM | POA: Diagnosis not present

## 2021-10-17 DIAGNOSIS — M5136 Other intervertebral disc degeneration, lumbar region: Secondary | ICD-10-CM | POA: Diagnosis not present

## 2021-10-17 DIAGNOSIS — M5416 Radiculopathy, lumbar region: Secondary | ICD-10-CM | POA: Diagnosis not present

## 2021-10-17 DIAGNOSIS — M5432 Sciatica, left side: Secondary | ICD-10-CM | POA: Diagnosis not present

## 2021-10-17 DIAGNOSIS — M9903 Segmental and somatic dysfunction of lumbar region: Secondary | ICD-10-CM | POA: Diagnosis not present

## 2021-10-20 DIAGNOSIS — M5416 Radiculopathy, lumbar region: Secondary | ICD-10-CM | POA: Diagnosis not present

## 2021-10-21 DIAGNOSIS — M25842 Other specified joint disorders, left hand: Secondary | ICD-10-CM | POA: Diagnosis not present

## 2021-10-21 DIAGNOSIS — M19042 Primary osteoarthritis, left hand: Secondary | ICD-10-CM | POA: Diagnosis not present

## 2021-10-21 DIAGNOSIS — M19041 Primary osteoarthritis, right hand: Secondary | ICD-10-CM | POA: Diagnosis not present

## 2021-10-24 DIAGNOSIS — M9903 Segmental and somatic dysfunction of lumbar region: Secondary | ICD-10-CM | POA: Diagnosis not present

## 2021-10-24 DIAGNOSIS — M5432 Sciatica, left side: Secondary | ICD-10-CM | POA: Diagnosis not present

## 2021-10-24 DIAGNOSIS — M5416 Radiculopathy, lumbar region: Secondary | ICD-10-CM | POA: Diagnosis not present

## 2021-10-24 DIAGNOSIS — M5136 Other intervertebral disc degeneration, lumbar region: Secondary | ICD-10-CM | POA: Diagnosis not present

## 2021-10-26 DIAGNOSIS — E1142 Type 2 diabetes mellitus with diabetic polyneuropathy: Secondary | ICD-10-CM | POA: Diagnosis not present

## 2021-10-26 DIAGNOSIS — L603 Nail dystrophy: Secondary | ICD-10-CM | POA: Diagnosis not present

## 2021-10-31 DIAGNOSIS — M5416 Radiculopathy, lumbar region: Secondary | ICD-10-CM | POA: Diagnosis not present

## 2021-10-31 DIAGNOSIS — M9903 Segmental and somatic dysfunction of lumbar region: Secondary | ICD-10-CM | POA: Diagnosis not present

## 2021-10-31 DIAGNOSIS — M5136 Other intervertebral disc degeneration, lumbar region: Secondary | ICD-10-CM | POA: Diagnosis not present

## 2021-10-31 DIAGNOSIS — M5432 Sciatica, left side: Secondary | ICD-10-CM | POA: Diagnosis not present

## 2021-11-09 DIAGNOSIS — M5416 Radiculopathy, lumbar region: Secondary | ICD-10-CM | POA: Diagnosis not present

## 2021-11-09 DIAGNOSIS — E119 Type 2 diabetes mellitus without complications: Secondary | ICD-10-CM | POA: Diagnosis not present

## 2021-12-01 DIAGNOSIS — M79645 Pain in left finger(s): Secondary | ICD-10-CM | POA: Diagnosis not present

## 2021-12-01 DIAGNOSIS — M67442 Ganglion, left hand: Secondary | ICD-10-CM | POA: Diagnosis not present

## 2021-12-02 DIAGNOSIS — M5416 Radiculopathy, lumbar region: Secondary | ICD-10-CM | POA: Diagnosis not present

## 2021-12-06 DIAGNOSIS — L308 Other specified dermatitis: Secondary | ICD-10-CM | POA: Diagnosis not present

## 2021-12-06 DIAGNOSIS — D239 Other benign neoplasm of skin, unspecified: Secondary | ICD-10-CM | POA: Diagnosis not present

## 2021-12-06 DIAGNOSIS — L570 Actinic keratosis: Secondary | ICD-10-CM | POA: Diagnosis not present

## 2021-12-06 DIAGNOSIS — L578 Other skin changes due to chronic exposure to nonionizing radiation: Secondary | ICD-10-CM | POA: Diagnosis not present

## 2021-12-06 DIAGNOSIS — Z872 Personal history of diseases of the skin and subcutaneous tissue: Secondary | ICD-10-CM | POA: Diagnosis not present

## 2021-12-06 DIAGNOSIS — M2550 Pain in unspecified joint: Secondary | ICD-10-CM | POA: Diagnosis not present

## 2021-12-07 DIAGNOSIS — E7800 Pure hypercholesterolemia, unspecified: Secondary | ICD-10-CM | POA: Diagnosis not present

## 2021-12-07 DIAGNOSIS — M5442 Lumbago with sciatica, left side: Secondary | ICD-10-CM | POA: Diagnosis not present

## 2021-12-07 DIAGNOSIS — G4733 Obstructive sleep apnea (adult) (pediatric): Secondary | ICD-10-CM | POA: Diagnosis not present

## 2021-12-07 DIAGNOSIS — N401 Enlarged prostate with lower urinary tract symptoms: Secondary | ICD-10-CM | POA: Diagnosis not present

## 2021-12-07 DIAGNOSIS — K219 Gastro-esophageal reflux disease without esophagitis: Secondary | ICD-10-CM | POA: Diagnosis not present

## 2021-12-07 DIAGNOSIS — Z0000 Encounter for general adult medical examination without abnormal findings: Secondary | ICD-10-CM | POA: Diagnosis not present

## 2021-12-07 DIAGNOSIS — Z1389 Encounter for screening for other disorder: Secondary | ICD-10-CM | POA: Diagnosis not present

## 2021-12-07 DIAGNOSIS — E1142 Type 2 diabetes mellitus with diabetic polyneuropathy: Secondary | ICD-10-CM | POA: Diagnosis not present

## 2021-12-07 DIAGNOSIS — R413 Other amnesia: Secondary | ICD-10-CM | POA: Diagnosis not present

## 2022-01-11 DIAGNOSIS — H409 Unspecified glaucoma: Secondary | ICD-10-CM | POA: Diagnosis not present

## 2022-01-11 DIAGNOSIS — Z961 Presence of intraocular lens: Secondary | ICD-10-CM | POA: Diagnosis not present

## 2022-01-11 DIAGNOSIS — H2012 Chronic iridocyclitis, left eye: Secondary | ICD-10-CM | POA: Diagnosis not present

## 2022-01-25 DIAGNOSIS — L909 Atrophic disorder of skin, unspecified: Secondary | ICD-10-CM | POA: Diagnosis not present

## 2022-01-25 DIAGNOSIS — E1142 Type 2 diabetes mellitus with diabetic polyneuropathy: Secondary | ICD-10-CM | POA: Diagnosis not present

## 2022-01-25 DIAGNOSIS — M792 Neuralgia and neuritis, unspecified: Secondary | ICD-10-CM | POA: Diagnosis not present

## 2022-01-25 DIAGNOSIS — M216X2 Other acquired deformities of left foot: Secondary | ICD-10-CM | POA: Diagnosis not present

## 2022-01-25 DIAGNOSIS — M79672 Pain in left foot: Secondary | ICD-10-CM | POA: Diagnosis not present

## 2022-01-25 DIAGNOSIS — I83893 Varicose veins of bilateral lower extremities with other complications: Secondary | ICD-10-CM | POA: Diagnosis not present

## 2022-01-25 DIAGNOSIS — L603 Nail dystrophy: Secondary | ICD-10-CM | POA: Diagnosis not present

## 2022-01-25 DIAGNOSIS — M79674 Pain in right toe(s): Secondary | ICD-10-CM | POA: Diagnosis not present

## 2022-01-25 DIAGNOSIS — M7742 Metatarsalgia, left foot: Secondary | ICD-10-CM | POA: Diagnosis not present

## 2022-02-13 DIAGNOSIS — H209 Unspecified iridocyclitis: Secondary | ICD-10-CM | POA: Diagnosis not present

## 2022-02-13 DIAGNOSIS — H4042X1 Glaucoma secondary to eye inflammation, left eye, mild stage: Secondary | ICD-10-CM | POA: Diagnosis not present

## 2022-05-15 IMAGING — CR DG CHEST 2V
2 series · 2 of 2 positions shown · non-contrast
Comparison: None.

CLINICAL DATA: Choked on pills.  Feels like still stuck in chest.

EXAM:
CHEST - 2 VIEW

[chest pa]
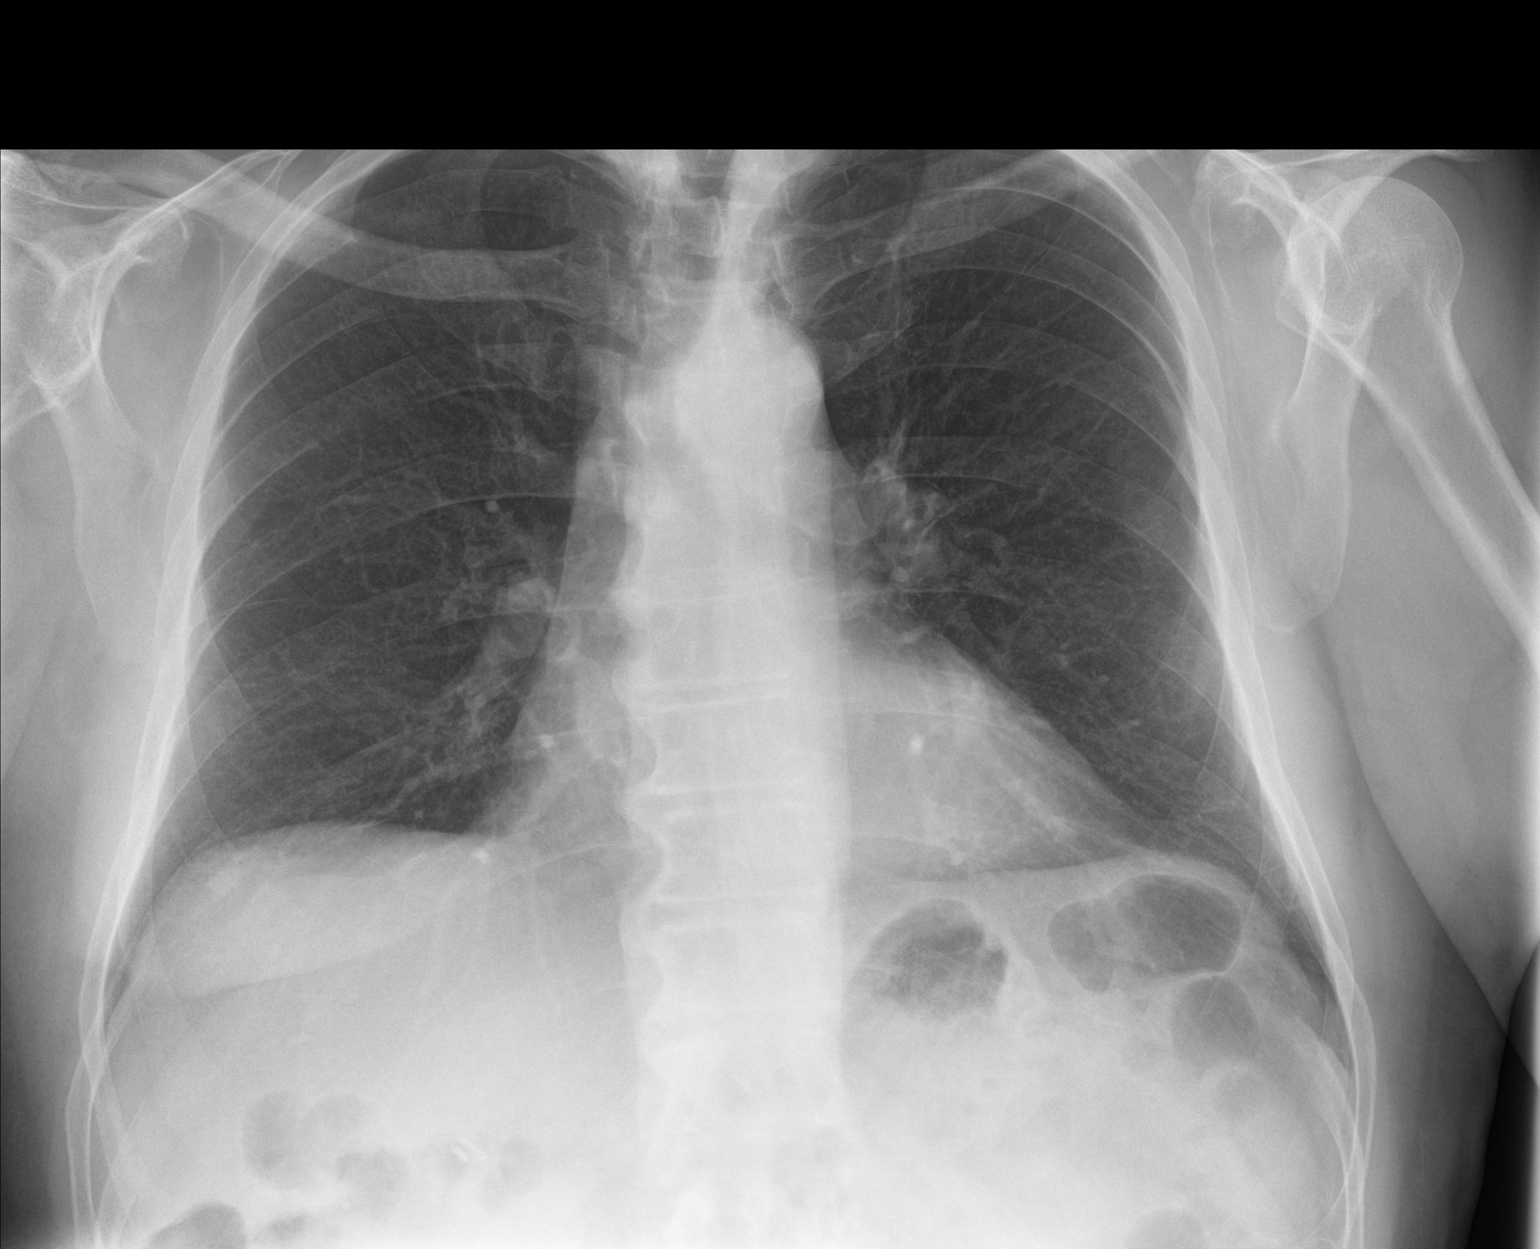

[chest lat]
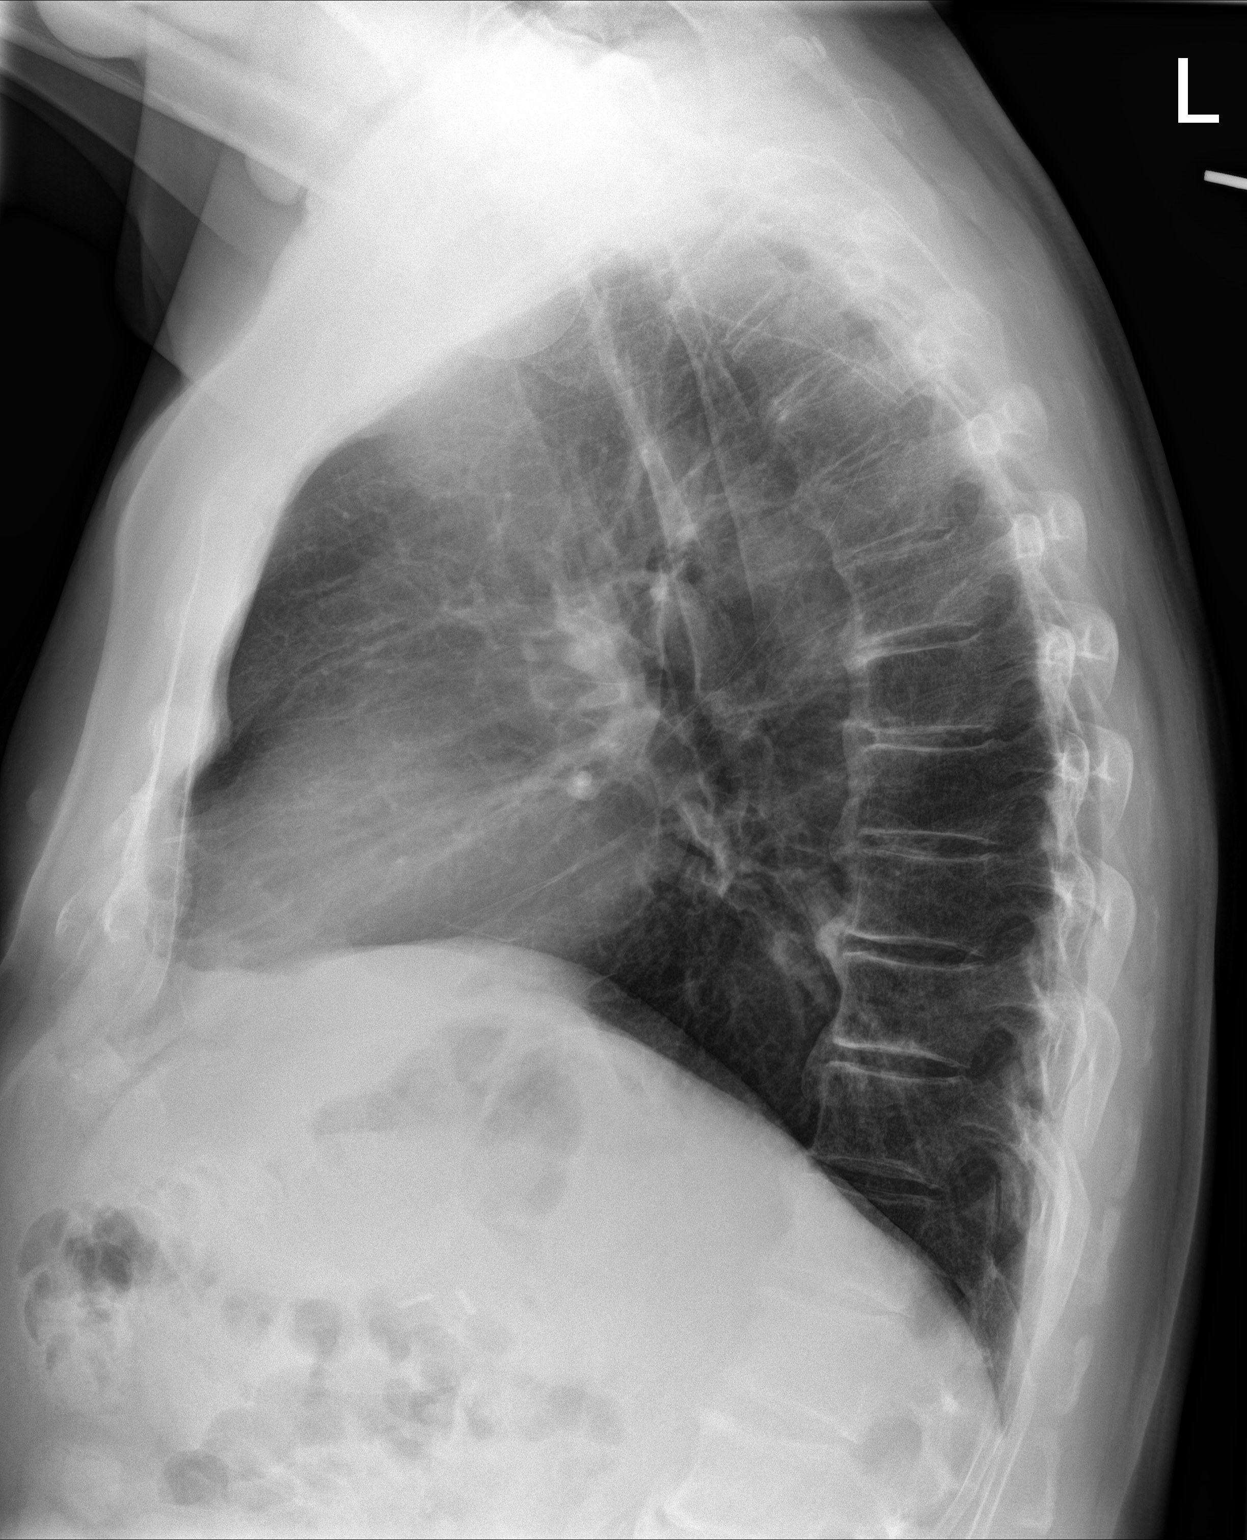

[2 of 2 positions shown; findings below may reference images not displayed]

FINDINGS: The cardiac silhouette, mediastinal and hilar contours are within
normal limits. The lungs are clear. No pleural effusions. No
pneumothorax. No pulmonary lesions. No radiopaque foreign body.

The bony thorax is intact.
IMPRESSION: No acute cardiopulmonary findings.

## 2022-05-25 DIAGNOSIS — M5416 Radiculopathy, lumbar region: Secondary | ICD-10-CM | POA: Diagnosis not present

## 2022-05-25 DIAGNOSIS — E119 Type 2 diabetes mellitus without complications: Secondary | ICD-10-CM | POA: Diagnosis not present

## 2022-06-04 DIAGNOSIS — E119 Type 2 diabetes mellitus without complications: Secondary | ICD-10-CM | POA: Diagnosis not present

## 2022-06-04 DIAGNOSIS — R410 Disorientation, unspecified: Secondary | ICD-10-CM | POA: Diagnosis not present

## 2022-06-04 DIAGNOSIS — R29818 Other symptoms and signs involving the nervous system: Secondary | ICD-10-CM | POA: Diagnosis not present

## 2022-06-04 DIAGNOSIS — I6381 Other cerebral infarction due to occlusion or stenosis of small artery: Secondary | ICD-10-CM | POA: Diagnosis not present

## 2022-06-04 DIAGNOSIS — E7800 Pure hypercholesterolemia, unspecified: Secondary | ICD-10-CM | POA: Diagnosis not present

## 2022-06-04 DIAGNOSIS — I10 Essential (primary) hypertension: Secondary | ICD-10-CM | POA: Diagnosis not present

## 2022-06-04 DIAGNOSIS — R29705 NIHSS score 5: Secondary | ICD-10-CM | POA: Diagnosis not present

## 2022-06-04 DIAGNOSIS — K219 Gastro-esophageal reflux disease without esophagitis: Secondary | ICD-10-CM | POA: Diagnosis not present

## 2022-06-04 DIAGNOSIS — G459 Transient cerebral ischemic attack, unspecified: Secondary | ICD-10-CM | POA: Diagnosis not present

## 2022-06-04 DIAGNOSIS — I639 Cerebral infarction, unspecified: Secondary | ICD-10-CM | POA: Diagnosis not present

## 2022-06-04 DIAGNOSIS — R29898 Other symptoms and signs involving the musculoskeletal system: Secondary | ICD-10-CM | POA: Diagnosis not present

## 2022-06-04 DIAGNOSIS — R29810 Facial weakness: Secondary | ICD-10-CM | POA: Diagnosis not present

## 2022-06-04 DIAGNOSIS — R200 Anesthesia of skin: Secondary | ICD-10-CM | POA: Diagnosis not present

## 2022-06-04 DIAGNOSIS — R4781 Slurred speech: Secondary | ICD-10-CM | POA: Diagnosis not present

## 2022-06-04 DIAGNOSIS — I6621 Occlusion and stenosis of right posterior cerebral artery: Secondary | ICD-10-CM | POA: Diagnosis not present

## 2022-06-04 DIAGNOSIS — H547 Unspecified visual loss: Secondary | ICD-10-CM | POA: Diagnosis not present

## 2022-06-04 DIAGNOSIS — G8194 Hemiplegia, unspecified affecting left nondominant side: Secondary | ICD-10-CM | POA: Diagnosis not present

## 2022-06-04 DIAGNOSIS — I6601 Occlusion and stenosis of right middle cerebral artery: Secondary | ICD-10-CM | POA: Diagnosis not present

## 2022-06-04 DIAGNOSIS — R471 Dysarthria and anarthria: Secondary | ICD-10-CM | POA: Diagnosis not present

## 2022-06-04 DIAGNOSIS — R531 Weakness: Secondary | ICD-10-CM | POA: Diagnosis not present

## 2022-06-05 DIAGNOSIS — I6381 Other cerebral infarction due to occlusion or stenosis of small artery: Secondary | ICD-10-CM | POA: Diagnosis not present

## 2022-06-05 DIAGNOSIS — E785 Hyperlipidemia, unspecified: Secondary | ICD-10-CM | POA: Diagnosis not present

## 2022-06-05 DIAGNOSIS — E119 Type 2 diabetes mellitus without complications: Secondary | ICD-10-CM | POA: Diagnosis not present

## 2022-06-09 DIAGNOSIS — R41841 Cognitive communication deficit: Secondary | ICD-10-CM | POA: Diagnosis not present

## 2022-06-09 DIAGNOSIS — E1142 Type 2 diabetes mellitus with diabetic polyneuropathy: Secondary | ICD-10-CM | POA: Diagnosis not present

## 2022-06-09 DIAGNOSIS — I83893 Varicose veins of bilateral lower extremities with other complications: Secondary | ICD-10-CM | POA: Diagnosis not present

## 2022-06-09 DIAGNOSIS — M79674 Pain in right toe(s): Secondary | ICD-10-CM | POA: Diagnosis not present

## 2022-06-09 DIAGNOSIS — R471 Dysarthria and anarthria: Secondary | ICD-10-CM | POA: Diagnosis not present

## 2022-06-09 DIAGNOSIS — M792 Neuralgia and neuritis, unspecified: Secondary | ICD-10-CM | POA: Diagnosis not present

## 2022-06-09 DIAGNOSIS — R4701 Aphasia: Secondary | ICD-10-CM | POA: Diagnosis not present

## 2022-06-09 DIAGNOSIS — M79675 Pain in left toe(s): Secondary | ICD-10-CM | POA: Diagnosis not present

## 2022-06-09 DIAGNOSIS — L603 Nail dystrophy: Secondary | ICD-10-CM | POA: Diagnosis not present

## 2022-06-15 DIAGNOSIS — I6381 Other cerebral infarction due to occlusion or stenosis of small artery: Secondary | ICD-10-CM | POA: Diagnosis not present

## 2022-06-15 DIAGNOSIS — G4733 Obstructive sleep apnea (adult) (pediatric): Secondary | ICD-10-CM | POA: Diagnosis not present

## 2022-07-03 DIAGNOSIS — I69320 Aphasia following cerebral infarction: Secondary | ICD-10-CM | POA: Diagnosis not present

## 2022-07-03 DIAGNOSIS — R488 Other symbolic dysfunctions: Secondary | ICD-10-CM | POA: Diagnosis not present

## 2022-07-06 DIAGNOSIS — R278 Other lack of coordination: Secondary | ICD-10-CM | POA: Diagnosis not present

## 2022-07-06 DIAGNOSIS — I69351 Hemiplegia and hemiparesis following cerebral infarction affecting right dominant side: Secondary | ICD-10-CM | POA: Diagnosis not present

## 2022-07-06 DIAGNOSIS — R262 Difficulty in walking, not elsewhere classified: Secondary | ICD-10-CM | POA: Diagnosis not present

## 2022-07-07 DIAGNOSIS — R262 Difficulty in walking, not elsewhere classified: Secondary | ICD-10-CM | POA: Diagnosis not present

## 2022-07-07 DIAGNOSIS — R278 Other lack of coordination: Secondary | ICD-10-CM | POA: Diagnosis not present

## 2022-07-07 DIAGNOSIS — I69351 Hemiplegia and hemiparesis following cerebral infarction affecting right dominant side: Secondary | ICD-10-CM | POA: Diagnosis not present

## 2022-07-13 DIAGNOSIS — R4701 Aphasia: Secondary | ICD-10-CM | POA: Diagnosis not present

## 2022-07-13 DIAGNOSIS — R488 Other symbolic dysfunctions: Secondary | ICD-10-CM | POA: Diagnosis not present

## 2022-07-13 DIAGNOSIS — R41841 Cognitive communication deficit: Secondary | ICD-10-CM | POA: Diagnosis not present

## 2022-07-13 DIAGNOSIS — R471 Dysarthria and anarthria: Secondary | ICD-10-CM | POA: Diagnosis not present

## 2022-07-13 DIAGNOSIS — I69320 Aphasia following cerebral infarction: Secondary | ICD-10-CM | POA: Diagnosis not present

## 2022-07-14 DIAGNOSIS — R278 Other lack of coordination: Secondary | ICD-10-CM | POA: Diagnosis not present

## 2022-07-14 DIAGNOSIS — R262 Difficulty in walking, not elsewhere classified: Secondary | ICD-10-CM | POA: Diagnosis not present

## 2022-07-14 DIAGNOSIS — I69351 Hemiplegia and hemiparesis following cerebral infarction affecting right dominant side: Secondary | ICD-10-CM | POA: Diagnosis not present

## 2022-07-19 ENCOUNTER — Telehealth: Payer: Self-pay | Admitting: *Deleted

## 2022-07-19 NOTE — Patient Outreach (Signed)
  Care Coordination   07/19/2022 Name: Daniel Manning MRN: [id number] DOB: 11-Mar-1946   Care Coordination Outreach Attempts:  An unsuccessful telephone outreach was attempted today to offer the patient information about available care coordination services as a benefit of their health plan.   Follow Up Plan:  Additional outreach attempts will be made to offer the patient care coordination information and services.   Encounter Outcome:  No Answer  Care Coordination Interventions Activated:  Yes   Care Coordination Interventions:  No, not indicated    [facility] [phone]

## 2022-07-21 ENCOUNTER — Encounter: Payer: Self-pay | Admitting: Emergency Medicine

## 2022-07-21 ENCOUNTER — Ambulatory Visit
Admission: EM | Admit: 2022-07-21 | Discharge: 2022-07-21 | Disposition: A | Discharge status: Home / Self Care | Payer: Medicare HMO | Attending: Emergency Medicine | Admitting: Emergency Medicine

## 2022-07-21 DIAGNOSIS — K219 Gastro-esophageal reflux disease without esophagitis: Secondary | ICD-10-CM | POA: Diagnosis not present

## 2022-07-21 DIAGNOSIS — R3129 Other microscopic hematuria: Secondary | ICD-10-CM | POA: Diagnosis not present

## 2022-07-21 DIAGNOSIS — E1139 Type 2 diabetes mellitus with other diabetic ophthalmic complication: Secondary | ICD-10-CM | POA: Diagnosis not present

## 2022-07-21 DIAGNOSIS — R918 Other nonspecific abnormal finding of lung field: Secondary | ICD-10-CM | POA: Diagnosis not present

## 2022-07-21 DIAGNOSIS — R1031 Right lower quadrant pain: Secondary | ICD-10-CM | POA: Diagnosis not present

## 2022-07-21 DIAGNOSIS — N132 Hydronephrosis with renal and ureteral calculous obstruction: Secondary | ICD-10-CM | POA: Diagnosis not present

## 2022-07-21 DIAGNOSIS — H409 Unspecified glaucoma: Secondary | ICD-10-CM | POA: Diagnosis not present

## 2022-07-21 DIAGNOSIS — E559 Vitamin D deficiency, unspecified: Secondary | ICD-10-CM | POA: Diagnosis not present

## 2022-07-21 DIAGNOSIS — N400 Enlarged prostate without lower urinary tract symptoms: Secondary | ICD-10-CM | POA: Diagnosis not present

## 2022-07-21 DIAGNOSIS — I10 Essential (primary) hypertension: Secondary | ICD-10-CM | POA: Diagnosis not present

## 2022-07-21 HISTORY — DX: Cerebral infarction, unspecified: I63.9

## 2022-07-21 LAB — URINALYSIS, MICROSCOPIC (REFLEX)

## 2022-07-21 LAB — URINALYSIS, ROUTINE W REFLEX MICROSCOPIC
Bilirubin Urine: NEGATIVE
Glucose, UA: NEGATIVE mg/dL
Leukocytes,Ua: NEGATIVE
Nitrite: NEGATIVE
Protein, ur: 30 mg/dL — AB
Specific Gravity, Urine: 1.015 (ref 1.005–1.030)
pH: 5.5 (ref 5.0–8.0)

## 2022-07-21 MED ORDER — KETOROLAC TROMETHAMINE 30 MG/ML IJ SOLN
30.0000 mg | Freq: Once | INTRAMUSCULAR | Status: AC
  Administered 2022-07-21: 30 mg via INTRAMUSCULAR

## 2022-07-21 MED ORDER — ONDANSETRON 4 MG PO TBDP
4.0000 mg | ORAL_TABLET | Freq: Once | ORAL | Status: AC
  Administered 2022-07-21: 4 mg via ORAL

## 2022-07-21 NOTE — ED Triage Notes (Signed)
Patient c/o lower abdominal pain that started about 5 days.  Patient reports pain or discomfort when he going to the bathroom.  Patient reports some dysuria.

## 2022-07-21 NOTE — ED Provider Notes (Addendum)
HPI  SUBJECTIVE:  Daniel Manning is a 76 y.o. male who presents with 5 to 6 days of right lower quadrant pain that radiates up his flank into his back and down into his groin and testicle.  It waxes and wanes, but has become constant, worse, severe this afternoon.  He reports dysuria, states he feels as if he is not able to completely empty his bladder.  The pain has not moved since it started.  No nausea, vomiting, urinary urgency, frequency, cloudy or odorous urine, hematuria, back swelling, testicular swelling, fevers, anorexia.  He tried tramadol with improvement in his symptoms and Tylenol.  No aggravating factors.  He states this feels similar to previous nephrolithiasis, although he states that they usually pass on their own by this time.  He has a past medical history of hypertension, CVA 6 weeks ago, on aspirin, BPH.  No history of UTI, prostatitis, diabetes, chronic kidney disease.    Past Medical History:  Diagnosis Date   Glaucoma    Pre-diabetes    Stroke [facility])     Past Surgical History:  Procedure Laterality Date   EYE SURGERY      Family History  Problem Relation Age of Onset   Cancer Mother    Heart attack Father     Social History   Tobacco Use   Smoking status: Former   Smokeless tobacco: Never  Vaping Use   Vaping Use: Never used  Substance Use Topics   Alcohol use: No   Drug use: Not Currently    No current facility-administered medications for this encounter.  Current Outpatient Medications:    amLODipine (NORVASC) 10 MG tablet, Take 1 tablet by mouth daily., Disp: , Rfl:    aspirin EC 81 MG tablet, Take by mouth., Disp: , Rfl:    brimonidine (ALPHAGAN) 0.2 % ophthalmic solution, Place 1 drop into the left eye 2 (two) times daily., Disp: , Rfl:    carboxymethylcellulose (REFRESH PLUS) 0.5 % SOLN, Apply to eye., Disp: , Rfl:    donepezil (ARICEPT) 5 MG tablet, Take by mouth., Disp: , Rfl:    LYRICA 50 MG capsule, Take 1 capsule 3 times a day by oral  route for 90 days., Disp: , Rfl:    Multiple Vitamin (MULTIVITAMIN) tablet, Take 1 tablet by mouth daily., Disp: , Rfl:    pantoprazole (PROTONIX) 40 MG tablet, Take 40 mg by mouth 2 (two) times daily., Disp: , Rfl:    timolol (BETIMOL) 0.5 % ophthalmic solution, Place 1 drop into the left eye 2 (two) times daily., Disp: , Rfl:    traMADol (ULTRAM) 50 MG tablet, Take 50 mg by mouth every 6 (six) hours as needed., Disp: , Rfl:    Carboxymeth-Glycerin-Polysorb 0.5-1-0.5 % SOLN, Apply 1 drop to eye as needed., Disp: , Rfl:    gabapentin (NEURONTIN) 300 MG capsule, Take 1 capsule by mouth at bedtime., Disp: , Rfl:    pravastatin (PRAVACHOL) 10 MG tablet, Take 10 mg by mouth at bedtime., Disp: , Rfl:    sucralfate (CARAFATE) 1 GM/10ML suspension, Take 10 mLs (1 g total) by mouth 4 (four) times daily -  with meals and at bedtime for 7 days., Disp: 280 mL, Rfl: 0   timolol (TIMOPTIC) 0.5 % ophthalmic solution, Place 1 drop into the left eye 2 (two) times daily., Disp: , Rfl:    valACYclovir (VALTREX) 500 MG tablet, Take 500 mg by mouth 2 (two) times daily. , Disp: , Rfl:   Allergies  Allergen Reactions   Combigan [Brimonidine Tartrate-Timolol] Other (See Comments)   Lumigan [Bimatoprost] Other (See Comments)   Moxeza [Moxifloxacin] Other (See Comments)   Pravastatin Other (See Comments)    Muscle pain   Zirgan [Ganciclovir] Other (See Comments)     ROS  As noted in HPI.   Physical Exam  BP (!) 210/99 (BP Location: Left Arm)   Pulse 79   Temp 98.4 F (36.9 C) (Oral)   Resp 15   Ht '5\' 7"'$  (1.702 m)   Wt 83.9 kg   SpO2 93%   BMI 28.97 kg/m   BP Readings from Last 3 Encounters:  07/21/22 (!) 210/99  02/01/21 (!) 171/71  01/26/21 (!) 142/77    Constitutional: Well developed, well nourished, appears to be very uncomfortable.  Unable to find comfortable position. Eyes:  EOMI, conjunctiva normal bilaterally HENT: Normocephalic, atraumatic,mucus membranes moist Respiratory: Normal  inspiratory effort Cardiovascular: Normal rate GI: nondistended soft.  Positive right lower quadrant tenderness.  No suprapubic tenderness.  No flank tenderness.  Positive obturator.  Negative Rovsing.  Hypoactive bowel sounds. Back: Right CVAT GU: No right inguinal tenderness, bulging.  No testicular or epididymal tenderness.  Normal circumcised male. skin: No rash, skin intact Musculoskeletal: no deformities Neurologic: Alert & oriented x 3, no focal neuro deficits Psychiatric: Speech and behavior appropriate   ED Course   Medications  ketorolac (TORADOL) 30 MG/ML injection 30 mg (30 mg Intramuscular Given 07/21/22 1705)  ondansetron (ZOFRAN-ODT) disintegrating tablet 4 mg (4 mg Oral Given 07/21/22 1707)    Orders Placed This Encounter  Procedures   Urinalysis, Routine w reflex microscopic Urine, Clean Catch    Standing Status:   Standing    Number of Occurrences:   1   Urinalysis, Microscopic (reflex)    Standing Status:   Standing    Number of Occurrences:   1    Results for orders placed or performed during the hospital encounter of 07/21/22 (from the past 24 hour(s))  Urinalysis, Routine w reflex microscopic Urine, Clean Catch     Status: Abnormal   Collection Time: 07/21/22  4:18 PM  Result Value Ref Range   Color, Urine YELLOW YELLOW   APPearance CLEAR CLEAR   Specific Gravity, Urine 1.015 1.005 - 1.030   pH 5.5 5.0 - 8.0   Glucose, UA NEGATIVE NEGATIVE mg/dL   Hgb urine dipstick MODERATE (A) NEGATIVE   Bilirubin Urine NEGATIVE NEGATIVE   Ketones, ur TRACE (A) NEGATIVE mg/dL   Protein, ur 30 (A) NEGATIVE mg/dL   Nitrite NEGATIVE NEGATIVE   Leukocytes,Ua NEGATIVE NEGATIVE  Urinalysis, Microscopic (reflex)     Status: Abnormal   Collection Time: 07/21/22  4:18 PM  Result Value Ref Range   RBC / HPF 11-20 0 - 5 RBC/hpf   WBC, UA 0-5 0 - 5 WBC/hpf   Bacteria, UA FEW (A) NONE SEEN   Squamous Epithelial / LPF 0-5 0 - 5   Mucus PRESENT    No results found.  ED  Clinical Impression  1. Right lower quadrant abdominal pain   2. Other microscopic hematuria      ED Assessment/Plan  UA with moderate hematuria, trace ketones and proteinuria.  Few bacteria, but no nitrites or esterase.  Calculated creatinine clearance from outside labs done in October 2023 84 mL/min.  Concern for obstructing nephrolithiasis versus appendicitis.  Favor nephrolithiasis with the hematuria.  Doubt leaking aortic abdominal aneurysm.  In the differential is incarcerated hernia, atypical diverticulitis/perforation.  Blood pressure elevated, but  he appears to be in a great deal of pain.  He states that he did not take his blood pressure medications this morning as his blood pressure was 121/74 this morning.  He denies headache, strokelike symptoms, chest pain pressure or heaviness.  He is neurologically intact.  Transferring to the emergency department for advanced imaging.  We do not have CT here available today.  Giving 30 mg Toradol IM and Zofran 4 mg ODT.  I feel that he is stable to go by private vehicle for further evaluation, pain control.  Discussed rationale for transfer to the emergency department with the patient.  He would like to go to the [facility] emergency department.  Patient agrees with plan.  Meds ordered this encounter  Medications   ketorolac (TORADOL) 30 MG/ML injection 30 mg   ondansetron (ZOFRAN-ODT) disintegrating tablet 4 mg      *This clinic note was created using [employer]. Therefore, there may be occasional mistakes despite careful proofreading.  ?    Melynda Ripple, MD 07/21/22 1710    Melynda Ripple, MD 07/21/22 1711

## 2022-07-21 NOTE — ED Notes (Signed)
Patient is being discharged from the Urgent Care and sent to the [facility] Emergency Department via private vehicle . Per Dr. Alphonzo Cruise, patient is in need of higher level of care due to hematuria and abdominal pain. Patient is aware and verbalizes understanding of plan of care.  Vitals:   07/21/22 1617  BP: (!) 210/99  Pulse: 79  Resp: 15  Temp: 98.4 F (36.9 C)  SpO2: 93%

## 2022-07-21 NOTE — Discharge Instructions (Signed)
I am concerned that you have an obstructing kidney stone versus an appendicitis.  Please go immediately to the emergency department for further evaluation.  I have given you 30 mg of Toradol IM here and Zofran 4 mg ODT.  Let them know if your pain changes, gets worse, if you start having strokelike symptoms, chest pain, pressure, heaviness, or for any other concerns.

## 2022-07-22 DIAGNOSIS — R1031 Right lower quadrant pain: Secondary | ICD-10-CM | POA: Diagnosis not present

## 2022-07-24 DIAGNOSIS — I69351 Hemiplegia and hemiparesis following cerebral infarction affecting right dominant side: Secondary | ICD-10-CM | POA: Diagnosis not present

## 2022-07-24 DIAGNOSIS — R278 Other lack of coordination: Secondary | ICD-10-CM | POA: Diagnosis not present

## 2022-07-24 DIAGNOSIS — R262 Difficulty in walking, not elsewhere classified: Secondary | ICD-10-CM | POA: Diagnosis not present

## 2022-07-31 DIAGNOSIS — R262 Difficulty in walking, not elsewhere classified: Secondary | ICD-10-CM | POA: Diagnosis not present

## 2022-07-31 DIAGNOSIS — I69351 Hemiplegia and hemiparesis following cerebral infarction affecting right dominant side: Secondary | ICD-10-CM | POA: Diagnosis not present

## 2022-07-31 DIAGNOSIS — R278 Other lack of coordination: Secondary | ICD-10-CM | POA: Diagnosis not present

## 2022-08-02 DIAGNOSIS — I10 Essential (primary) hypertension: Secondary | ICD-10-CM | POA: Diagnosis not present

## 2022-08-02 DIAGNOSIS — E1142 Type 2 diabetes mellitus with diabetic polyneuropathy: Secondary | ICD-10-CM | POA: Diagnosis not present

## 2022-08-02 DIAGNOSIS — K219 Gastro-esophageal reflux disease without esophagitis: Secondary | ICD-10-CM | POA: Diagnosis not present

## 2022-08-02 DIAGNOSIS — Z8673 Personal history of transient ischemic attack (TIA), and cerebral infarction without residual deficits: Secondary | ICD-10-CM | POA: Diagnosis not present

## 2022-08-02 DIAGNOSIS — Z1331 Encounter for screening for depression: Secondary | ICD-10-CM | POA: Diagnosis not present

## 2022-08-04 DIAGNOSIS — R262 Difficulty in walking, not elsewhere classified: Secondary | ICD-10-CM | POA: Diagnosis not present

## 2022-08-04 DIAGNOSIS — R278 Other lack of coordination: Secondary | ICD-10-CM | POA: Diagnosis not present

## 2022-08-04 DIAGNOSIS — I69351 Hemiplegia and hemiparesis following cerebral infarction affecting right dominant side: Secondary | ICD-10-CM | POA: Diagnosis not present

## 2022-08-10 DIAGNOSIS — R278 Other lack of coordination: Secondary | ICD-10-CM | POA: Diagnosis not present

## 2022-08-10 DIAGNOSIS — R262 Difficulty in walking, not elsewhere classified: Secondary | ICD-10-CM | POA: Diagnosis not present

## 2022-08-10 DIAGNOSIS — I69351 Hemiplegia and hemiparesis following cerebral infarction affecting right dominant side: Secondary | ICD-10-CM | POA: Diagnosis not present

## 2022-08-11 DIAGNOSIS — I69351 Hemiplegia and hemiparesis following cerebral infarction affecting right dominant side: Secondary | ICD-10-CM | POA: Diagnosis not present

## 2022-08-11 DIAGNOSIS — R278 Other lack of coordination: Secondary | ICD-10-CM | POA: Diagnosis not present

## 2022-08-11 DIAGNOSIS — R262 Difficulty in walking, not elsewhere classified: Secondary | ICD-10-CM | POA: Diagnosis not present

## 2022-08-17 DIAGNOSIS — I69351 Hemiplegia and hemiparesis following cerebral infarction affecting right dominant side: Secondary | ICD-10-CM | POA: Diagnosis not present

## 2022-08-17 DIAGNOSIS — R278 Other lack of coordination: Secondary | ICD-10-CM | POA: Diagnosis not present

## 2022-08-17 DIAGNOSIS — R262 Difficulty in walking, not elsewhere classified: Secondary | ICD-10-CM | POA: Diagnosis not present

## 2022-08-24 DIAGNOSIS — R262 Difficulty in walking, not elsewhere classified: Secondary | ICD-10-CM | POA: Diagnosis not present

## 2022-08-24 DIAGNOSIS — I69351 Hemiplegia and hemiparesis following cerebral infarction affecting right dominant side: Secondary | ICD-10-CM | POA: Diagnosis not present

## 2022-08-24 DIAGNOSIS — R278 Other lack of coordination: Secondary | ICD-10-CM | POA: Diagnosis not present

## 2022-08-31 DIAGNOSIS — R438 Other disturbances of smell and taste: Secondary | ICD-10-CM | POA: Diagnosis not present

## 2022-08-31 DIAGNOSIS — R5382 Chronic fatigue, unspecified: Secondary | ICD-10-CM | POA: Diagnosis not present

## 2022-08-31 DIAGNOSIS — E7800 Pure hypercholesterolemia, unspecified: Secondary | ICD-10-CM | POA: Diagnosis not present

## 2022-09-05 DIAGNOSIS — N201 Calculus of ureter: Secondary | ICD-10-CM | POA: Diagnosis not present

## 2022-09-05 DIAGNOSIS — N200 Calculus of kidney: Secondary | ICD-10-CM | POA: Diagnosis not present

## 2022-09-07 DIAGNOSIS — N201 Calculus of ureter: Secondary | ICD-10-CM | POA: Diagnosis not present

## 2022-09-07 DIAGNOSIS — Z87442 Personal history of urinary calculi: Secondary | ICD-10-CM | POA: Diagnosis not present

## 2022-09-29 DIAGNOSIS — Z789 Other specified health status: Secondary | ICD-10-CM | POA: Diagnosis not present

## 2022-09-29 DIAGNOSIS — E7800 Pure hypercholesterolemia, unspecified: Secondary | ICD-10-CM | POA: Diagnosis not present

## 2022-09-29 DIAGNOSIS — Z8673 Personal history of transient ischemic attack (TIA), and cerebral infarction without residual deficits: Secondary | ICD-10-CM | POA: Diagnosis not present

## 2022-10-02 DIAGNOSIS — I10 Essential (primary) hypertension: Secondary | ICD-10-CM | POA: Diagnosis not present

## 2022-10-02 DIAGNOSIS — E119 Type 2 diabetes mellitus without complications: Secondary | ICD-10-CM | POA: Diagnosis not present

## 2022-10-02 DIAGNOSIS — Z8673 Personal history of transient ischemic attack (TIA), and cerebral infarction without residual deficits: Secondary | ICD-10-CM | POA: Diagnosis not present

## 2022-10-06 DIAGNOSIS — N201 Calculus of ureter: Secondary | ICD-10-CM | POA: Diagnosis not present

## 2022-10-16 DIAGNOSIS — Z961 Presence of intraocular lens: Secondary | ICD-10-CM | POA: Diagnosis not present

## 2022-10-16 DIAGNOSIS — H4042X1 Glaucoma secondary to eye inflammation, left eye, mild stage: Secondary | ICD-10-CM | POA: Diagnosis not present

## 2022-10-16 DIAGNOSIS — H209 Unspecified iridocyclitis: Secondary | ICD-10-CM | POA: Diagnosis not present

## 2022-11-23 DIAGNOSIS — N200 Calculus of kidney: Secondary | ICD-10-CM | POA: Diagnosis not present

## 2023-01-08 DIAGNOSIS — E7800 Pure hypercholesterolemia, unspecified: Secondary | ICD-10-CM | POA: Diagnosis not present

## 2023-01-08 DIAGNOSIS — E119 Type 2 diabetes mellitus without complications: Secondary | ICD-10-CM | POA: Diagnosis not present

## 2023-01-08 DIAGNOSIS — I10 Essential (primary) hypertension: Secondary | ICD-10-CM | POA: Diagnosis not present

## 2023-02-01 DIAGNOSIS — I10 Essential (primary) hypertension: Secondary | ICD-10-CM | POA: Diagnosis not present

## 2023-02-01 DIAGNOSIS — R29898 Other symptoms and signs involving the musculoskeletal system: Secondary | ICD-10-CM | POA: Diagnosis not present

## 2023-02-01 DIAGNOSIS — R2681 Unsteadiness on feet: Secondary | ICD-10-CM | POA: Diagnosis not present

## 2023-02-08 ENCOUNTER — Ambulatory Visit
Admission: EM | Admit: 2023-02-08 | Discharge: 2023-02-08 | Disposition: A | Discharge status: Home / Self Care | Payer: Medicare HMO | Attending: Family Medicine | Admitting: Family Medicine

## 2023-02-08 ENCOUNTER — Encounter: Payer: Self-pay | Admitting: Emergency Medicine

## 2023-02-08 DIAGNOSIS — B9789 Other viral agents as the cause of diseases classified elsewhere: Secondary | ICD-10-CM | POA: Insufficient documentation

## 2023-02-08 DIAGNOSIS — J069 Acute upper respiratory infection, unspecified: Secondary | ICD-10-CM

## 2023-02-08 DIAGNOSIS — Z1152 Encounter for screening for COVID-19: Secondary | ICD-10-CM | POA: Diagnosis not present

## 2023-02-08 LAB — SARS CORONAVIRUS 2 BY RT PCR: SARS Coronavirus 2 by RT PCR: NEGATIVE

## 2023-02-08 MED ORDER — BENZONATATE 100 MG PO CAPS: 100.0000 mg | ORAL_CAPSULE | Freq: Three times a day (TID) | ORAL | 0 refills | Status: DC

## 2023-02-08 MED ORDER — PROMETHAZINE-DM 6.25-15 MG/5ML PO SYRP: 2.5000 mL | ORAL_SOLUTION | Freq: Four times a day (QID) | ORAL | 0 refills | Status: DC | PRN

## 2023-02-08 NOTE — Discharge Instructions (Addendum)
We will contact you if your COVID test is positive.  Please quarantine while you wait for the results.  If your test is negative you may resume normal activities.  If your test is positive, quarantine until you are without a fever for at least 24 hours without fever-lowering (Tylenol/Motrin) medications.    If your were prescribed medication, stop by the pharmacy to pick them up.   You can take Tylenol and/or Ibuprofen as needed for fever reduction and pain relief.    For cough: honey 1/2 to 1 teaspoon (you can dilute the honey in water or another fluid).  You can also use guaifenesin and dextromethorphan for cough. You can use a humidifier for chest congestion and cough.  If you don't have a humidifier, you can sit in the bathroom with the hot shower running.      For sore throat: try warm salt water gargles, Mucinex sore throat cough drops or cepacol lozenges, throat spray, warm tea or water with lemon/honey, popsicles or ice, or OTC cold relief medicine for throat discomfort. You can also purchase chloraseptic spray at the pharmacy or dollar store.   For congestion: take a daily anti-histamine like Zyrtec, Claritin, and a oral decongestant, such as pseudoephedrine.  You can also use Flonase 1-2 sprays in each nostril daily. Afrin is also a good option, if you do not have high blood pressure.    It is important to stay hydrated: drink plenty of fluids (water, gatorade/powerade/pedialyte, juices, or teas) to keep your throat moisturized and help further relieve irritation/discomfort.    Return or go to the Emergency Department if symptoms worsen or do not improve in the next few days  

## 2023-02-08 NOTE — ED Provider Notes (Addendum)
[facility]    CSN: [id number] Arrival date & time: 02/08/23  1727      History   Chief Complaint Chief Complaint  Patient presents with   Sore Throat   Cough    HPI Daniel Manning is a 77 y.o. male.   HPI  History obtained from the patient. Daniel Manning presents for cough, congestion and sore throat for 3 days.  His partner gave him some tea that helped the sore throat. Has chest congestion. Cough gets worse at night. He has not taken a COVID test. No history of asthma or tobacco use. Denies shortness of breath. His grandchild has been sick.  No fever, vomiting, diarrhea and no chills.     Past Medical History:  Diagnosis Date   Glaucoma    Pre-diabetes    Stroke [facility])     There are no problems to display for this patient.   Past Surgical History:  Procedure Laterality Date   EYE SURGERY         Home Medications    Prior to Admission medications   Medication Sig Start Date End Date Taking? Authorizing Provider  benzonatate (TESSALON) 100 MG capsule Take 1 capsule (100 mg total) by mouth every 8 (eight) hours. 02/08/23  Yes Jakelyn Squyres, DO  promethazine-dextromethorphan (PROMETHAZINE-DM) 6.25-15 MG/5ML syrup Take 2.5-5 mLs by mouth 4 (four) times daily as needed. 02/08/23  Yes Stella Encarnacion, DO  amLODipine (NORVASC) 10 MG tablet Take 1 tablet by mouth daily. 06/18/22 06/18/23  [provider]  aspirin EC 81 MG tablet Take by mouth. 06/05/22 06/05/23  [provider]  brimonidine (ALPHAGAN) 0.2 % ophthalmic solution Place 1 drop into the left eye 2 (two) times daily.    [provider]  Carboxymeth-Glycerin-Polysorb 0.5-1-0.5 % SOLN Apply 1 drop to eye as needed.    [provider]  carboxymethylcellulose (REFRESH PLUS) 0.5 % SOLN Apply to eye. 06/01/21   [provider]  donepezil (ARICEPT) 5 MG tablet Take by mouth. 06/13/19 07/21/22  [provider]  gabapentin (NEURONTIN) 300 MG capsule Take 1 capsule  by mouth at bedtime. 01/13/21   [provider]  LYRICA 50 MG capsule Take 1 capsule 3 times a day by oral route for 90 days. 12/02/21   [provider]  Multiple Vitamin (MULTIVITAMIN) tablet Take 1 tablet by mouth daily.    [provider]  pantoprazole (PROTONIX) 40 MG tablet Take 40 mg by mouth 2 (two) times daily.    [provider]  pravastatin (PRAVACHOL) 10 MG tablet Take 10 mg by mouth at bedtime.    [provider]  sucralfate (CARAFATE) 1 GM/10ML suspension Take 10 mLs (1 g total) by mouth 4 (four) times daily -  with meals and at bedtime for 7 days. 05/28/20 06/04/20  Eusebio Friendly B, PA-C  timolol (BETIMOL) 0.5 % ophthalmic solution Place 1 drop into the left eye 2 (two) times daily.    [provider]  timolol (TIMOPTIC) 0.5 % ophthalmic solution Place 1 drop into the left eye 2 (two) times daily.    [provider]  traMADol (ULTRAM) 50 MG tablet Take 50 mg by mouth every 6 (six) hours as needed.    [provider]  valACYclovir (VALTREX) 500 MG tablet Take 500 mg by mouth 2 (two) times daily.     [provider]    Family History Family History  Problem Relation Age of Onset   Cancer Mother    Heart  attack Father     Social History Social History   Tobacco Use   Smoking status: Former   Smokeless tobacco: Never  Building services engineer Use: Never used  Substance Use Topics   Alcohol use: No   Drug use: Not Currently     Allergies   Brimonidine tartrate-timolol, Ezetimibe, Lumigan [bimatoprost], Moxeza [moxifloxacin], Pravastatin, Zirgan [ganciclovir], and Evolocumab   Review of Systems Review of Systems: negative unless otherwise stated in HPI.      Physical Exam Triage Vital Signs ED Triage Vitals  Enc Vitals Group     BP 02/08/23 1734 118/72     Pulse Rate 02/08/23 1734 71     Resp 02/08/23 1734 16     Temp 02/08/23 1734 98.1 F (36.7 C)     Temp Source 02/08/23 1734 Oral      SpO2 02/08/23 1734 96 %     Weight --      Height --      Head Circumference --      Peak Flow --      Pain Score 02/08/23 1733 0     Pain Loc --      Pain Edu? --      Excl. in GC? --    No data found.  Updated Vital Signs BP 118/72 (BP Location: Left Arm)   Pulse 71   Temp 98.1 F (36.7 C) (Oral)   Resp 16   SpO2 96%   Visual Acuity Right Eye Distance:   Left Eye Distance:   Bilateral Distance:    Right Eye Near:   Left Eye Near:    Bilateral Near:     Physical Exam GEN:     alert, non-toxic appearing pleasant elderly male in no distress    HENT:  mucus membranes moist, oropharyngeal without lesions or erythema, no tonsillar hypertrophy or exudates, moderate erythematous edematous turbinates, clear nasal discharge, bilateral TM normal with some cerumen EYES:   pupils equal and reactive, no scleral injection or discharge NECK:  normal ROM, no lymphadenopathy, no meningismus   RESP:  no increased work of breathing, clear to auscultation bilaterally CVS:   regular rate and rhythm Skin:   warm and dry, no rash on visible skin    UC Treatments / Results  Labs (all labs ordered are listed, but only abnormal results are displayed) Labs Reviewed  SARS CORONAVIRUS 2 BY RT PCR    EKG   Radiology No results found.  Procedures Procedures (including critical care time)  Medications Ordered in UC Medications - No data to display  Initial Impression / Assessment and Plan / UC Course  I have reviewed the triage vital signs and the nursing notes.  Pertinent labs & imaging results that were available during my care of the patient were reviewed by me and considered in my medical decision making (see chart for details).       Pt is a 78 y.o. male who presents for 3 days of respiratory symptoms. Daniel Manning is afebrile here without recent antipyretics. Satting well on room air. Overall pt is non-toxic appearing, well hydrated, without respiratory distress. Pulmonary exam is  unremarkable.  COVID testing obtained. Pt to quarantine until COVID test results or longer if positive.  I will call patient with test results, if positive. History consistent with viral respiratory illness. Discussed symptomatic treatment.  Explained lack of efficacy of antibiotics in viral disease.  Typical duration of symptoms discussed.  Tessalon Perles and Promethazine DM for cough.  Patient declined Atrovent nasal spray.  COVID test was negative.  Return and ED precautions given and voiced understanding. Discussed MDM, treatment plan and plan for follow-up with patient who agrees with plan.     Final Clinical Impressions(s) / UC Diagnoses   Final diagnoses:  Viral upper respiratory tract infection with cough     Discharge Instructions      We will contact you if your COVID test is positive.  Please quarantine while you wait for the results.  If your test is negative you may resume normal activities.  If your test is positive, quarantine until you are without a fever for at least 24 hours without fever-lowering (Tylenol/Motrin) medications.    If your were prescribed medication, stop by the pharmacy to pick them up.   You can take Tylenol and/or Ibuprofen as needed for fever reduction and pain relief.    For cough: honey 1/2 to 1 teaspoon (you can dilute the honey in water or another fluid).  You can also use guaifenesin and dextromethorphan for cough. You can use a humidifier for chest congestion and cough.  If you don't have a humidifier, you can sit in the bathroom with the hot shower running.      For sore throat: try warm salt water gargles, Mucinex sore throat cough drops or cepacol lozenges, throat spray, warm tea or water with lemon/honey, popsicles or ice, or OTC cold relief medicine for throat discomfort. You can also purchase chloraseptic spray at the pharmacy or dollar store.   For congestion: take a daily anti-histamine like Zyrtec, Claritin, and a oral decongestant,  such as pseudoephedrine.  You can also use Flonase 1-2 sprays in each nostril daily. Afrin is also a good option, if you do not have high blood pressure.    It is important to stay hydrated: drink plenty of fluids (water, gatorade/powerade/pedialyte, juices, or teas) to keep your throat moisturized and help further relieve irritation/discomfort.    Return or go to the Emergency Department if symptoms worsen or do not improve in the next few days      ED Prescriptions     Medication Sig Dispense Auth. Provider   promethazine-dextromethorphan (PROMETHAZINE-DM) 6.25-15 MG/5ML syrup Take 2.5-5 mLs by mouth 4 (four) times daily as needed. 118 mL Enza Shone, DO   benzonatate (TESSALON) 100 MG capsule Take 1 capsule (100 mg total) by mouth every 8 (eight) hours. 21 capsule Katha Cabal, DO      PDMP not reviewed this encounter.   Katha Cabal, DO 02/08/23 1816    Katha Cabal, DO 02/08/23 1908

## 2023-02-08 NOTE — ED Triage Notes (Signed)
Pt c/o sore throat, productive cough and congestion x 2 days. Pt has tried OTC medication with no relief.

## 2023-02-15 DIAGNOSIS — I10 Essential (primary) hypertension: Secondary | ICD-10-CM | POA: Diagnosis not present

## 2023-02-19 DIAGNOSIS — M5416 Radiculopathy, lumbar region: Secondary | ICD-10-CM | POA: Diagnosis not present

## 2023-02-26 DIAGNOSIS — L02212 Cutaneous abscess of back [any part, except buttock]: Secondary | ICD-10-CM | POA: Diagnosis not present

## 2023-03-10 DIAGNOSIS — M545 Low back pain, unspecified: Secondary | ICD-10-CM | POA: Diagnosis not present

## 2023-03-10 DIAGNOSIS — M2560 Stiffness of unspecified joint, not elsewhere classified: Secondary | ICD-10-CM | POA: Diagnosis not present

## 2023-03-10 DIAGNOSIS — M6281 Muscle weakness (generalized): Secondary | ICD-10-CM | POA: Diagnosis not present

## 2023-03-15 DIAGNOSIS — E1142 Type 2 diabetes mellitus with diabetic polyneuropathy: Secondary | ICD-10-CM | POA: Diagnosis not present

## 2023-03-15 DIAGNOSIS — G4733 Obstructive sleep apnea (adult) (pediatric): Secondary | ICD-10-CM | POA: Diagnosis not present

## 2023-03-28 ENCOUNTER — Other Ambulatory Visit: Payer: Self-pay

## 2023-03-28 ENCOUNTER — Ambulatory Visit
Admission: EM | Admit: 2023-03-28 | Discharge: 2023-03-28 | Disposition: A | Discharge status: Home / Self Care | Payer: Medicare HMO | Attending: Emergency Medicine | Admitting: Emergency Medicine

## 2023-03-28 DIAGNOSIS — U071 COVID-19: Secondary | ICD-10-CM | POA: Diagnosis not present

## 2023-03-28 MED ORDER — PROMETHAZINE-DM 6.25-15 MG/5ML PO SYRP: 5.0000 mL | ORAL_SOLUTION | Freq: Four times a day (QID) | ORAL | 0 refills | Status: DC | PRN

## 2023-03-28 MED ORDER — NIRMATRELVIR/RITONAVIR (PAXLOVID)TABLET: 3.0000 | ORAL_TABLET | Freq: Two times a day (BID) | ORAL | 0 refills | Status: DC

## 2023-03-28 MED ORDER — NIRMATRELVIR/RITONAVIR (PAXLOVID)TABLET: 3.0000 | ORAL_TABLET | Freq: Two times a day (BID) | ORAL | 0 refills | Status: AC

## 2023-03-28 NOTE — ED Provider Notes (Addendum)
HPI  SUBJECTIVE:  Daniel Manning is a 77 y.o. male who presents with a positive home COVID test, headache, scratchy throat, nasal congestion, excessive clear rhinorrhea, postnasal drip, body aches starting yesterday.  No fevers above 100.4.  No wheezing, shortness of breath, nausea, vomiting, abdominal pain.  Some diarrhea yesterday.  He was unable to sleep at night because of the cough.  His daughter and grandson, who he has had close contact with both have COVID.  He did not get any of the the COVID vaccines-states cannot take them.  No antipyretic in past 6 hours.  No aggravating or alleviating factors.  He has not tried anything for this. Patient has a past medical history of diabetes, hypercholesterolemia, glaucoma, GERD, CVA. PCP: [facility].  GFR from outside labs done in June 2024 62 mL/min  Past Medical History:  Diagnosis Date   Glaucoma    Pre-diabetes    Stroke [facility])     Past Surgical History:  Procedure Laterality Date   EYE SURGERY      Family History  Problem Relation Age of Onset   Cancer Mother    Heart attack Father     Social History   Tobacco Use   Smoking status: Former   Smokeless tobacco: Never  Vaping Use   Vaping status: Never Used  Substance Use Topics   Alcohol use: No   Drug use: Not Currently    No current facility-administered medications for this encounter.  Current Outpatient Medications:    amLODipine (NORVASC) 10 MG tablet, Take 1 tablet by mouth daily., Disp: , Rfl:    aspirin EC 81 MG tablet, Take by mouth., Disp: , Rfl:    brimonidine (ALPHAGAN) 0.2 % ophthalmic solution, Place 1 drop into the left eye 2 (two) times daily., Disp: , Rfl:    Carboxymeth-Glycerin-Polysorb 0.5-1-0.5 % SOLN, Apply 1 drop to eye as needed., Disp: , Rfl:    carboxymethylcellulose (REFRESH PLUS) 0.5 % SOLN, Apply to eye., Disp: , Rfl:    Multiple Vitamin (MULTIVITAMIN) tablet, Take 1 tablet by mouth daily., Disp: , Rfl:    nirmatrelvir/ritonavir  (PAXLOVID) 20 x 150 MG & 10 x 100MG  TABS, Take 3 tablets by mouth 2 (two) times daily for 5 days. Patient GFR is 62. Take nirmatrelvir (150 mg) two tablets twice daily for 5 days and ritonavir (100 mg) one tablet twice daily for 5 days., Disp: 30 tablet, Rfl: 0   pantoprazole (PROTONIX) 40 MG tablet, Take 40 mg by mouth 2 (two) times daily., Disp: , Rfl:    promethazine-dextromethorphan (PROMETHAZINE-DM) 6.25-15 MG/5ML syrup, Take 5 mLs by mouth 4 (four) times daily as needed for cough., Disp: 118 mL, Rfl: 0   sucralfate (CARAFATE) 1 GM/10ML suspension, Take 10 mLs (1 g total) by mouth 4 (four) times daily -  with meals and at bedtime for 7 days., Disp: 280 mL, Rfl: 0   timolol (BETIMOL) 0.5 % ophthalmic solution, Place 1 drop into the left eye 2 (two) times daily., Disp: , Rfl:    timolol (TIMOPTIC) 0.5 % ophthalmic solution, Place 1 drop into the left eye 2 (two) times daily., Disp: , Rfl:    donepezil (ARICEPT) 5 MG tablet, Take by mouth., Disp: , Rfl:    LYRICA 50 MG capsule, Take 1 capsule 3 times a day by oral route for 90 days., Disp: , Rfl:   Allergies  Allergen Reactions   Brimonidine Tartrate-Timolol Other (See Comments)    Red, swelling, eyes   Ezetimibe  Diarrhea   Lumigan [Bimatoprost] Other (See Comments)   Moxeza [Moxifloxacin] Other (See Comments)   Pravastatin Other (See Comments)    Muscle pain   Zirgan [Ganciclovir] Other (See Comments)   Evolocumab Other (See Comments)     ROS  As noted in HPI.   Physical Exam  BP 123/72   Pulse 87   Temp 99 F (37.2 C)   Resp 18   SpO2 97%   Constitutional: Well developed, well nourished, no acute distress Eyes:  EOMI, conjunctiva normal bilaterally HENT: Normocephalic, atraumatic,mucus membranes moist.  Positive nasal congestion. Neck: No cervical adenopathy Respiratory: Normal inspiratory effort, lungs clear bilaterally Cardiovascular: Normal rate, regular rhythm, no murmurs rubs or gallops GI: nondistended skin: No  rash, skin intact Musculoskeletal: no deformities Neurologic: Alert & oriented x 3, no focal neuro deficits Psychiatric: Speech and behavior appropriate   ED Course   Medications - No data to display  No orders of the defined types were placed in this encounter.   No results found for this or any previous visit (from the past 24 hour(s)). No results found.  ED Clinical Impression  1. COVID-19 virus infection      ED Assessment/Plan     Outside medical records, labs reviewed.  Additional medical history obtained.  Patient presents with COVID infection.  Will send home with Paxlovid as at risk for progression to severe illness based on his comorbidities, daily nasal irrigation, promethazine DM.  Patient states Promethazine DM worked well for him last time.  No Atrovent nasal spray due to the glaucoma.  May take Tylenol as needed.  Patient to either cut his amlodipine dose in half while taking the Paxlovid or take it every other day.  Follow-up with PCP in several days.  ER return precautions given.  Paxlovid not e-prescribed.  Called pharmacy and called in prescription.  They do not have any today-will get it tomorrow.  They will fill it for him when it comes in tomorrow, and notify him.  If he wants to come back and get it.  He has 4 more days to fill this.  Will have staff notify patient of this plan.  Discussed  MDM, treatment plan, and plan for follow-up with patient. Discussed sn/sx that should prompt return to the ED. patient agrees with plan.   Meds ordered this encounter  Medications   nirmatrelvir/ritonavir (PAXLOVID) 20 x 150 MG & 10 x 100MG  TABS    Sig: Take 3 tablets by mouth 2 (two) times daily for 5 days. Patient GFR is 62. Take nirmatrelvir (150 mg) two tablets twice daily for 5 days and ritonavir (100 mg) one tablet twice daily for 5 days.    Dispense:  30 tablet    Refill:  0   promethazine-dextromethorphan (PROMETHAZINE-DM) 6.25-15 MG/5ML syrup    Sig: Take 5  mLs by mouth 4 (four) times daily as needed for cough.    Dispense:  118 mL    Refill:  0      *This clinic note was created using Scientist, clinical (histocompatibility and immunogenetics). Therefore, there may be occasional mistakes despite careful proofreading.  ?    Domenick Gong, MD 03/28/23 1348    Domenick Gong, MD 03/28/23 1446

## 2023-03-28 NOTE — ED Triage Notes (Signed)
Pt states he was having symptoms of scratchy throat and fever. Tested himself for COVID and it came up positive. Would like some medication to help the cough and symptoms

## 2023-03-28 NOTE — Discharge Instructions (Addendum)
Unfortunately, I cannot prescribe Atrovent nasal spray because of the glaucoma or Flonase because of the Paxlovid.  Use saline nasal irrigation with a Lloyd Huger Med rinse and distilled water as often as you want to help reduce nasal congestion and postnasal drip.  Finish the [employer].  This is an antiviral that will help prevent you from progressing to severe illness.  Either cut your amlodipine in half or take it every other day when taking the Paxlovid.  Promethazine DM for cough as needed.  May take Tylenol 500 to 1000 mg 3 times a day.

## 2023-04-03 DIAGNOSIS — N200 Calculus of kidney: Secondary | ICD-10-CM | POA: Diagnosis not present

## 2023-04-05 DIAGNOSIS — M5416 Radiculopathy, lumbar region: Secondary | ICD-10-CM | POA: Diagnosis not present

## 2023-04-06 DIAGNOSIS — Z8673 Personal history of transient ischemic attack (TIA), and cerebral infarction without residual deficits: Secondary | ICD-10-CM | POA: Diagnosis not present

## 2023-04-06 DIAGNOSIS — Z789 Other specified health status: Secondary | ICD-10-CM | POA: Diagnosis not present

## 2023-04-06 DIAGNOSIS — E7800 Pure hypercholesterolemia, unspecified: Secondary | ICD-10-CM | POA: Diagnosis not present

## 2023-04-09 DIAGNOSIS — I10 Essential (primary) hypertension: Secondary | ICD-10-CM | POA: Diagnosis not present

## 2023-04-09 DIAGNOSIS — E1165 Type 2 diabetes mellitus with hyperglycemia: Secondary | ICD-10-CM | POA: Diagnosis not present

## 2023-04-14 DIAGNOSIS — M2560 Stiffness of unspecified joint, not elsewhere classified: Secondary | ICD-10-CM | POA: Diagnosis not present

## 2023-04-14 DIAGNOSIS — M6281 Muscle weakness (generalized): Secondary | ICD-10-CM | POA: Diagnosis not present

## 2023-04-14 DIAGNOSIS — M545 Low back pain, unspecified: Secondary | ICD-10-CM | POA: Diagnosis not present

## 2023-04-16 DIAGNOSIS — M5416 Radiculopathy, lumbar region: Secondary | ICD-10-CM | POA: Diagnosis not present

## 2023-04-16 DIAGNOSIS — E1165 Type 2 diabetes mellitus with hyperglycemia: Secondary | ICD-10-CM | POA: Diagnosis not present

## 2023-04-23 DIAGNOSIS — L308 Other specified dermatitis: Secondary | ICD-10-CM | POA: Diagnosis not present

## 2023-04-23 DIAGNOSIS — L821 Other seborrheic keratosis: Secondary | ICD-10-CM | POA: Diagnosis not present

## 2023-04-23 DIAGNOSIS — L578 Other skin changes due to chronic exposure to nonionizing radiation: Secondary | ICD-10-CM | POA: Diagnosis not present

## 2023-04-23 DIAGNOSIS — Z872 Personal history of diseases of the skin and subcutaneous tissue: Secondary | ICD-10-CM | POA: Diagnosis not present

## 2023-04-23 DIAGNOSIS — L720 Epidermal cyst: Secondary | ICD-10-CM | POA: Diagnosis not present

## 2023-04-24 DIAGNOSIS — H209 Unspecified iridocyclitis: Secondary | ICD-10-CM | POA: Diagnosis not present

## 2023-04-24 DIAGNOSIS — H4042X1 Glaucoma secondary to eye inflammation, left eye, mild stage: Secondary | ICD-10-CM | POA: Diagnosis not present

## 2023-04-24 DIAGNOSIS — Z961 Presence of intraocular lens: Secondary | ICD-10-CM | POA: Diagnosis not present

## 2023-12-27 ENCOUNTER — Ambulatory Visit (INDEPENDENT_AMBULATORY_CARE_PROVIDER_SITE_OTHER)

## 2023-12-27 ENCOUNTER — Ambulatory Visit
Admission: EM | Admit: 2023-12-27 | Discharge: 2023-12-27 | Disposition: A | Discharge status: Home / Self Care | Attending: Family Medicine | Admitting: Family Medicine

## 2023-12-27 ENCOUNTER — Encounter: Payer: Self-pay | Admitting: Emergency Medicine

## 2023-12-27 DIAGNOSIS — R051 Acute cough: Secondary | ICD-10-CM | POA: Insufficient documentation

## 2023-12-27 DIAGNOSIS — B349 Viral infection, unspecified: Secondary | ICD-10-CM | POA: Diagnosis not present

## 2023-12-27 LAB — RESP PANEL BY RT-PCR (FLU A&B, COVID) ARPGX2
Influenza A by PCR: NEGATIVE
Influenza B by PCR: NEGATIVE
SARS Coronavirus 2 by RT PCR: NEGATIVE

## 2023-12-27 MED ORDER — BENZONATATE 100 MG PO CAPS: 100.0000 mg | ORAL_CAPSULE | Freq: Three times a day (TID) | ORAL | 0 refills | Status: DC

## 2023-12-27 NOTE — ED Provider Notes (Addendum)
 [facility]    CSN: [id number] Arrival date & time: 12/27/23  1342      History   Chief Complaint Chief Complaint  Patient presents with   Cough   Fever   Fatigue   Headache    HPI Daniel Manning is a 78 y.o. male  presents for evaluation of URI symptoms for 1-2 days. Patient reports associated symptoms of cough, congestion, headache, fever of 101, body aches, fatigue. Denies ST, N/V/D, ear pain, shortness of breath. Patient does not have a hx of asthma. Patient is not an active smoker.   Reports no sick contacts.  Pt has taken nothing OTC for symptoms. Pt has no other concerns at this time.    Cough Associated symptoms: fever, headaches and myalgias   Fever Associated symptoms: congestion, cough, headaches and myalgias   Headache Associated symptoms: congestion, cough, fever and myalgias     Past Medical History:  Diagnosis Date   Glaucoma    Pre-diabetes    Stroke (HCC)     There are no active problems to display for this patient.   Past Surgical History:  Procedure Laterality Date   EYE SURGERY         Home Medications    Prior to Admission medications   Medication Sig Start Date End Date Taking? Authorizing Provider  benzonatate  (TESSALON ) 100 MG capsule Take 1 capsule (100 mg total) by mouth every 8 (eight) hours. 12/27/23  Yes Torrin Crihfield, Jodi R, NP  amLODipine (NORVASC) 10 MG tablet Take 1 tablet by mouth daily. 06/18/22 06/18/23  [provider]  brimonidine (ALPHAGAN) 0.2 % ophthalmic solution Place 1 drop into the left eye 2 (two) times daily.    [provider]  Carboxymeth-Glycerin-Polysorb 0.5-1-0.5 % SOLN Apply 1 drop to eye as needed.    [provider]  carboxymethylcellulose (REFRESH PLUS) 0.5 % SOLN Apply to eye. 06/01/21   [provider]  donepezil (ARICEPT) 5 MG tablet Take by mouth. 06/13/19 07/21/22  [provider]  LYRICA 50 MG capsule Take 1 capsule 3 times a day by oral route for  90 days. 12/02/21   [provider]  Multiple Vitamin (MULTIVITAMIN) tablet Take 1 tablet by mouth daily.    [provider]  pantoprazole (PROTONIX) 40 MG tablet Take 40 mg by mouth 2 (two) times daily.    [provider]  promethazine -dextromethorphan (PROMETHAZINE -DM) 6.25-15 MG/5ML syrup Take 5 mLs by mouth 4 (four) times daily as needed for cough. 03/28/23   Ethlyn Herd, MD  sucralfate  (CARAFATE ) 1 GM/10ML suspension Take 10 mLs (1 g total) by mouth 4 (four) times daily -  with meals and at bedtime for 7 days. 05/28/20 03/28/23  Nancy Axon B, PA-C  timolol (BETIMOL) 0.5 % ophthalmic solution Place 1 drop into the left eye 2 (two) times daily.    [provider]  timolol (TIMOPTIC) 0.5 % ophthalmic solution Place 1 drop into the left eye 2 (two) times daily.    [provider]    Family History Family History  Problem Relation Age of Onset   Cancer Mother    Heart attack Father     Social History Social History   Tobacco Use   Smoking status: Former   Smokeless tobacco: Never  Advertising account planner   Vaping status: Never Used  Substance Use Topics   Alcohol use: No   Drug use: Not Currently     Allergies   Brimonidine tartrate-timolol, Ezetimibe, Lumigan [bimatoprost], Metformin, Moxeza [  moxifloxacin], Pravastatin, Zirgan [ganciclovir], and Evolocumab   Review of Systems Review of Systems  Constitutional:  Positive for fever.  HENT:  Positive for congestion.   Respiratory:  Positive for cough.   Musculoskeletal:  Positive for myalgias.  Neurological:  Positive for headaches.     Physical Exam Triage Vital Signs ED Triage Vitals  Encounter Vitals Group     BP 12/27/23 1357 107/73     Systolic BP Percentile --      Diastolic BP Percentile --      Pulse Rate 12/27/23 1357 (!) 124     Resp 12/27/23 1357 18     Temp 12/27/23 1357 98.6 F (37 C)     Temp Source 12/27/23 1357 Oral     SpO2 12/27/23 1357 94 %     Weight --       Height --      Head Circumference --      Peak Flow --      Pain Score 12/27/23 1356 7     Pain Loc --      Pain Education --      Exclude from Growth Chart --    No data found.  Updated Vital Signs BP 107/73 (BP Location: Left Arm)   Pulse (!) 124   Temp 98.6 F (37 C) (Oral)   Resp 18   SpO2 94%   Visual Acuity Right Eye Distance:   Left Eye Distance:   Bilateral Distance:    Right Eye Near:   Left Eye Near:    Bilateral Near:     Physical Exam Vitals and nursing note reviewed.  Constitutional:      General: He is not in acute distress.    Appearance: Normal appearance. He is not ill-appearing or toxic-appearing.  HENT:     Head: Normocephalic and atraumatic.     Right Ear: Tympanic membrane and ear canal normal.     Left Ear: Tympanic membrane and ear canal normal.     Nose: Congestion present.     Mouth/Throat:     Mouth: Mucous membranes are moist.     Pharynx: No oropharyngeal exudate or posterior oropharyngeal erythema.  Eyes:     Pupils: Pupils are equal, round, and reactive to light.  Cardiovascular:     Rate and Rhythm: Normal rate and regular rhythm.     Heart sounds: Normal heart sounds.     Comments: Tachycardia 124 on intake, resolved at recheck Pulmonary:     Effort: Pulmonary effort is normal.     Breath sounds: Normal breath sounds. No wheezing, rhonchi or rales.  Musculoskeletal:     Cervical back: Normal range of motion and neck supple.  Lymphadenopathy:     Cervical: No cervical adenopathy.  Skin:    General: Skin is warm and dry.  Neurological:     General: No focal deficit present.     Mental Status: He is alert and oriented to person, place, and time.  Psychiatric:        Mood and Affect: Mood normal.        Behavior: Behavior normal.      UC Treatments / Results  Labs (all labs ordered are listed, but only abnormal results are displayed) Labs Reviewed  RESP PANEL BY RT-PCR (FLU A&B, COVID) [id number]   Comprehensive  Metabolic Panel (CMP) Order: [id number] Component Ref Range & Units 1 mo ago  Sodium 135 - 145 mmol/L 136  Potassium 3.5 - 5.0 mmol/L 4.6  Chloride  98 - 108 mmol/L 103  Carbon Dioxide (CO2) 21 - 30 mmol/L 25  Urea  Nitrogen (BUN) 7 - 20 mg/dL 11  Creatinine 0.6 - 1.3 mg/dL 0.7  Glucose 70 - 409 mg/dL 811  Comment: Interpretive Data: Above is the NONFASTING reference range.  Below are the FASTING reference ranges: NORMAL:      70-99 mg/dL PREDIABETES: 914-782 mg/dL DIABETES:    > 956 mg/dL  Calcium 8.7 - 21.3 mg/dL 9.6  AST (Aspartate Aminotransferase) 15 - 41 U/L 23  ALT (Alanine Aminotransferase) 15 - 50 U/L 20  Bilirubin, Total 0.4 - 1.5 mg/dL 0.5  Alk Phos (Alkaline Phosphatase) 24 - 110 U/L 75  Albumin 3.5 - 4.8 g/dL 3.9  Protein, Total 6.2 - 8.1 g/dL 7.1  Anion Gap 3 - 12 mmol/L 8  BUN/CREA Ratio 6 - 27 15  Glomerular Filtration Rate (eGFR) mL/min/1.73sq m 94  Comment: CKD-EPI (2021) does not include patient's race in the calculation of eGFR. Monitoring changes of plasma creatinine and eGFR over time is useful for monitoring kidney function.  This change was made on 11/02/2020.  Interpretive Ranges for eGFR(CKD-EPI 2021):  eGFR:              > 60 mL/min/1.73 sq m - Normal eGFR:              30 - 59 mL/min/1.73 sq m - Moderately Decreased eGFR:              15 - 29 mL/min/1.73 sq m - Severely Decreased eGFR:              < 15 mL/min/1.73 sq m -  Kidney Failure   Note: These eGFR calculations do not apply in acute situations when eGFR is changing rapidly or in patients on dialysis.  Resulting Agency [facility]   Specimen Collected: 10/31/23 10:54   EKG   Radiology No results found.  Procedures Procedures (including critical care time)  Medications Ordered in UC Medications - No data to display  Initial Impression / Assessment and Plan / UC Course  I have reviewed the triage vital signs and the nursing notes.  Pertinent  labs & imaging results that were available during my care of the patient were reviewed by me and considered in my medical decision making (see chart for details).  Clinical Course as of 12/27/23 1523  Thu Dec 27, 2023  1521 HR recheck 96 [JM]    Clinical Course User Index [JM] Alleen Arbour, NP    Exam and symptoms with patient.  No red flags.  Negative flu and COVID PCR.  Wet read of chest x-ray without obvious consolidation, will contact for any positive results based on radiology overread.  Discussed viral illness and symptomatic treatment.  Tessalon  as needed cough.  Advise rest fluids and PCP follow-up if symptoms do not improve.  ER precautions reviewed and patient verbalized understanding. Final Clinical Impressions(s) / UC Diagnoses   Final diagnoses:  Acute cough  Viral illness     Discharge Instructions      You tested negative for COVID and flu.  You may take Tessalon  as needed for your cough. Please treat your symptoms with over the counter  tylenol  or ibuprofen, humidifier, and rest. Viral illnesses can last 7-14 days. Please follow up with your PCP if your symptoms are not improving. Please go to the ER for any worsening symptoms. This includes but is not limited to fever you can not control with tylenol   or ibuprofen, you are not able to stay hydrated, you have shortness of breath or chest pain.  Thank you for choosing [facility] for your healthcare needs. I hope you feel better soon! \     ED Prescriptions     Medication Sig Dispense Auth. Provider   benzonatate  (TESSALON ) 100 MG capsule Take 1 capsule (100 mg total) by mouth every 8 (eight) hours. 21 capsule Selby Slovacek, Jodi R, NP      PDMP not reviewed this encounter.   Alleen Arbour, NP 12/27/23 1523    Alleen Arbour, NP 12/27/23 1523    Alleen Arbour, NP 12/27/23 1524

## 2023-12-27 NOTE — Discharge Instructions (Signed)
 You tested negative for COVID and flu.  You may take Tessalon  as needed for your cough. Please treat your symptoms with over the counter  tylenol  or ibuprofen, humidifier, and rest. Viral illnesses can last 7-14 days. Please follow up with your PCP if your symptoms are not improving. Please go to the ER for any worsening symptoms. This includes but is not limited to fever you can not control with tylenol  or ibuprofen, you are not able to stay hydrated, you have shortness of breath or chest pain.  Thank you for choosing [facility] for your healthcare needs. I hope you feel better soon! \

## 2023-12-27 NOTE — ED Triage Notes (Signed)
 Pt presents with a cough, headache, fever, bodyaches, and chest congestion since yesterday.

## 2024-09-22 ENCOUNTER — Ambulatory Visit
Admission: EM | Admit: 2024-09-22 | Discharge: 2024-09-22 | Disposition: A | Discharge status: Home / Self Care | Source: Home / Self Care

## 2024-09-22 ENCOUNTER — Encounter: Payer: Self-pay | Admitting: Emergency Medicine

## 2024-09-22 ENCOUNTER — Ambulatory Visit (HOSPITAL_COMMUNITY): Payer: Self-pay

## 2024-09-22 ENCOUNTER — Ambulatory Visit

## 2024-09-22 DIAGNOSIS — J069 Acute upper respiratory infection, unspecified: Secondary | ICD-10-CM

## 2024-09-22 DIAGNOSIS — R051 Acute cough: Secondary | ICD-10-CM | POA: Diagnosis not present

## 2024-09-22 DIAGNOSIS — B349 Viral infection, unspecified: Secondary | ICD-10-CM

## 2024-09-22 MED ORDER — ALBUTEROL SULFATE HFA 108 (90 BASE) MCG/ACT IN AERS: 2.0000 | INHALATION_SPRAY | RESPIRATORY_TRACT | 0 refills | Status: AC | PRN

## 2024-09-22 MED ORDER — PROMETHAZINE-DM 6.25-15 MG/5ML PO SYRP: 5.0000 mL | ORAL_SOLUTION | Freq: Four times a day (QID) | ORAL | 0 refills | Status: AC | PRN

## 2024-09-22 MED ORDER — AEROCHAMBER MV MISC: 2 refills | Status: AC

## 2024-09-22 MED ORDER — BENZONATATE 100 MG PO CAPS: 100.0000 mg | ORAL_CAPSULE | Freq: Three times a day (TID) | ORAL | 0 refills | Status: AC

## 2024-09-22 MED ORDER — IPRATROPIUM BROMIDE 0.06 % NA SOLN: 2.0000 | Freq: Four times a day (QID) | NASAL | 12 refills | Status: AC

## 2024-09-22 NOTE — ED Provider Notes (Addendum)
 " [facility]    CSN: [id number] Arrival date & time: 09/22/24  1328      History   Chief Complaint Chief Complaint  Patient presents with   Cough    HPI Daniel Manning is a 79 y.o. male.   HPI  79 year old male with past medical history significant for CVA, prediabetes, glaucoma presents for evaluation of respiratory symptoms that started 3 days ago.  He reports that he became ill from his son along who has similar respiratory symptoms.  He is describing runny nose, nasal congestion, productive cough or green sputum, shortness of breath, and wheezing.  No fever.  He reports that the cough is keeping him up at night.  Past Medical History:  Diagnosis Date   Glaucoma    Pre-diabetes    Stroke [facility])     There are no active problems to display for this patient.   Past Surgical History:  Procedure Laterality Date   EYE SURGERY         Home Medications    Prior to Admission medications  Medication Sig Start Date End Date Taking? Authorizing Provider  albuterol  (VENTOLIN  HFA) 108 (90 Base) MCG/ACT inhaler Inhale 2 puffs into the lungs every 4 (four) hours as needed. 09/22/24  Yes Bernardino Ditch, NP  Alirocumab 75 MG/ML SOAJ Inject 75 mg into the skin. 11/13/23  Yes [provider]  ipratropium (ATROVENT ) 0.06 % nasal spray Place 2 sprays into both nostrils 4 (four) times daily. 09/22/24  Yes Bernardino Ditch, NP  meclizine (ANTIVERT) 25 MG tablet Take 25 mg by mouth 3 (three) times daily as needed. 09/02/24  Yes [provider]  Spacer/Aero-Holding Chambers (AEROCHAMBER MV) inhaler Use as instructed 09/22/24  Yes Bernardino Ditch, NP  amLODipine (NORVASC) 10 MG tablet Take 1 tablet by mouth daily. 06/18/22 06/18/23  [provider]  benzonatate  (TESSALON ) 100 MG capsule Take 1 capsule (100 mg total) by mouth every 8 (eight) hours. 09/22/24   Bernardino Ditch, NP  brimonidine (ALPHAGAN) 0.2 % ophthalmic solution Place 1 drop into the left eye 2 (two)  times daily.    [provider]  Carboxymeth-Glycerin-Polysorb 0.5-1-0.5 % SOLN Apply 1 drop to eye as needed.    [provider]  carboxymethylcellulose (REFRESH PLUS) 0.5 % SOLN Apply to eye. 06/01/21   [provider]  donepezil (ARICEPT) 5 MG tablet Take by mouth. 06/13/19 07/21/22  [provider]  glipiZIDE (GLUCOTROL XL) 5 MG 24 hr tablet Take 5 mg by mouth daily.    [provider]  lisinopril (ZESTRIL) 10 MG tablet Take 10 mg by mouth daily.    [provider]  LYRICA 50 MG capsule Take 1 capsule 3 times a day by oral route for 90 days. 12/02/21   [provider]  Multiple Vitamin (MULTIVITAMIN) tablet Take 1 tablet by mouth daily.    [provider]  pantoprazole (PROTONIX) 40 MG tablet Take 40 mg by mouth 2 (two) times daily.    [provider]  promethazine -dextromethorphan (PROMETHAZINE -DM) 6.25-15 MG/5ML syrup Take 5 mLs by mouth 4 (four) times daily as needed for cough. 09/22/24   Bernardino Ditch, NP  sucralfate  (CARAFATE ) 1 GM/10ML suspension Take 10 mLs (1 g total) by mouth 4 (four) times daily -  with meals and at bedtime for 7 days. 05/28/20 03/28/23  Arvis Huxley B, PA-C  timolol (BETIMOL) 0.5 % ophthalmic solution Place 1 drop into the left eye 2 (two) times daily.    [provider]  timolol (TIMOPTIC) 0.5 % ophthalmic solution Place 1 drop into the left eye 2 (two) times daily.    [provider]    Family History Family History  Problem Relation Age of Onset   Cancer Mother    Heart attack Father     Social History Social History[1]   Allergies   Brimonidine tartrate-timolol, Ezetimibe, Lumigan [bimatoprost], Metformin, Moxeza [moxifloxacin], Pravastatin, Zirgan [ganciclovir], and Evolocumab   Review of Systems Review of Systems  Constitutional:  Negative for fever.  HENT:  Positive for congestion and rhinorrhea.   Respiratory:  Positive for cough, shortness of breath  and wheezing.      Physical Exam Triage Vital Signs ED Triage Vitals  Encounter Vitals Group     BP --      Girls Systolic BP Percentile --      Girls Diastolic BP Percentile --      Boys Systolic BP Percentile --      Boys Diastolic BP Percentile --      Pulse --      Resp --      Temp --      Temp src --      SpO2 --      Weight 09/22/24 1429 187 lb (84.8 kg)     Height --      Head Circumference --      Peak Flow --      Pain Score 09/22/24 1428 0     Pain Loc --      Pain Education --      Exclude from Growth Chart --    No data found.  Updated Vital Signs BP 133/77 (BP Location: Left Arm)   Pulse 83   Temp 98 F (36.7 C) (Oral)   Resp 16   Wt 187 lb (84.8 kg)   SpO2 96%   BMI 29.29 kg/m   Visual Acuity Right Eye Distance:   Left Eye Distance:   Bilateral Distance:    Right Eye Near:   Left Eye Near:    Bilateral Near:     Physical Exam Vitals and nursing note reviewed.  Constitutional:      Appearance: Normal appearance. He is not ill-appearing.  HENT:     Head: Normocephalic and atraumatic.     Right Ear: Tympanic membrane, ear canal and external ear normal. There is no impacted cerumen.     Left Ear: Tympanic membrane, ear canal and external ear normal. There is no impacted cerumen.     Nose: Congestion and rhinorrhea present.     Comments: Please mucosas erythematous and mildly edematous with scant clear discharge in both nares.    Mouth/Throat:     Mouth: Mucous membranes are moist.     Pharynx: Oropharynx is clear. No oropharyngeal exudate or posterior oropharyngeal erythema.  Cardiovascular:     Rate and Rhythm: Normal rate and regular rhythm.     Pulses: Normal pulses.     Heart sounds: Normal heart sounds. No murmur heard.    No friction rub. No gallop.  Pulmonary:     Effort: Pulmonary effort is normal.     Breath sounds: Rhonchi present. No wheezing or rales.     Comments: Coarse lung sounds in the right upper lobe  posteriorly. Musculoskeletal:     Cervical back: Normal range of motion and neck supple. No tenderness.  Lymphadenopathy:     Cervical: No cervical adenopathy.  Skin:    General: Skin is warm and  dry.     Capillary Refill: Capillary refill takes less than 2 seconds.     Findings: No rash.  Neurological:     General: No focal deficit present.     Mental Status: He is alert and oriented to person, place, and time.      UC Treatments / Results  Labs (all labs ordered are listed, but only abnormal results are displayed) Labs Reviewed - No data to display  EKG   Radiology No results found.  Procedures Procedures (including critical care time)  Medications Ordered in UC Medications - No data to display  Initial Impression / Assessment and Plan / UC Course  I have reviewed the triage vital signs and the nursing notes.  Pertinent labs & imaging results that were available during my care of the patient were reviewed by me and considered in my medical decision making (see chart for details).   Patient is a nontoxic-appearing 44-year-old male presenting for evaluation 3 days with respiratory symptoms as outlined in HPI above.  His most significant complaint is his cough that he describes as being deep and productive.  That is keeping him up at night.  He has not had a fever.  His physical exam does reveal inflammation of his upper respiratory tract as well as coarse lung sounds in the right upper lobe posteriorly to chest auscultation.  I suspect this is most likely viral, but I will obtain a chest x-ray to evaluate for any acute cardiopulmonary pathology.  Chest x-ray independently reviewed and evaluated by me.  Impression: Lung fields are well aerated without evidence of infiltrate or effusion.  Cardiomediastinal silhouette appears normal.  Radiology overread is pending. Radiology impression states no acute cardiopulmonary abnormality.  I will discharge patient on the diagnosis of  viral URI with cough.  Prescribe Atrovent  nasal spray for his congestion and Tessalon  Perles and Promethazine  DM cough syrup for cough and congestion.  I will also prescribe an albuterol  inhaler and spacer and he can do 1 or 2 puffs every 4-6 hours as needed for shortness breath or wheezing.   Final Clinical Impressions(s) / UC Diagnoses   Final diagnoses:  Acute cough  Viral URI with cough     Discharge Instructions      Your chest x-ray did not show any evidence of pneumonia.  I do believe that you have a respiratory virus based upon your symptoms and exam.  Use the albuterol  inhaler, with the spacer, and take 1 or 2 puffs every 4-6 hours as needed for any shortness of breath or wheezing.  Use over-the-counter Tylenol  and/or ibuprofen if you develop any fever or have pain.  Use the Atrovent  nasal spray, 2 squirts in each nostril every 6 hours, as needed for runny nose and postnasal drip.  Use the Tessalon  Perles every 8 hours during the day.  Take them with a small sip of water.  They may give you some numbness to the base of your tongue or a metallic taste in your mouth, this is normal.  Use the Promethazine  DM cough syrup at bedtime for cough and congestion.  It will make you drowsy so do not take it during the day.  Return for reevaluation or see your primary care provider for any new or worsening symptoms.      ED Prescriptions     Medication Sig Dispense Auth. Provider   Spacer/Aero-Holding Chambers (AEROCHAMBER MV) inhaler Use as instructed 1 each Bernardino Ditch, NP   albuterol  (VENTOLIN  HFA) 108 (90  Base) MCG/ACT inhaler Inhale 2 puffs into the lungs every 4 (four) hours as needed. 8 g Bernardino Ditch, NP   benzonatate  (TESSALON ) 100 MG capsule Take 1 capsule (100 mg total) by mouth every 8 (eight) hours. 21 capsule Bernardino Ditch, NP   promethazine -dextromethorphan (PROMETHAZINE -DM) 6.25-15 MG/5ML syrup Take 5 mLs by mouth 4 (four) times daily as needed for cough. 118 mL Bernardino Ditch, NP   ipratropium (ATROVENT ) 0.06 % nasal spray Place 2 sprays into both nostrils 4 (four) times daily. 15 mL Bernardino Ditch, NP      PDMP not reviewed this encounter.    Bernardino Ditch, NP 09/22/24 1503     [1]  Social History Tobacco Use   Smoking status: Former   Smokeless tobacco: Never  Vaping Use   Vaping status: Never Used  Substance Use Topics   Alcohol use: No   Drug use: Not Currently     Bernardino Ditch, NP 09/22/24 1600  "

## 2024-09-22 NOTE — ED Triage Notes (Signed)
 Pt presents with a deep- productive cough x 3 days. Pt has taken tylenol  and OTC cough syrup.

## 2024-09-22 NOTE — Discharge Instructions (Addendum)
 Your chest x-ray did not show any evidence of pneumonia.  I do believe that you have a respiratory virus based upon your symptoms and exam.  Use the albuterol  inhaler, with the spacer, and take 1 or 2 puffs every 4-6 hours as needed for any shortness of breath or wheezing.  Use over-the-counter Tylenol  and/or ibuprofen if you develop any fever or have pain.  Use the Atrovent  nasal spray, 2 squirts in each nostril every 6 hours, as needed for runny nose and postnasal drip.  Use the Tessalon  Perles every 8 hours during the day.  Take them with a small sip of water.  They may give you some numbness to the base of your tongue or a metallic taste in your mouth, this is normal.  Use the Promethazine  DM cough syrup at bedtime for cough and congestion.  It will make you drowsy so do not take it during the day.  Return for reevaluation or see your primary care provider for any new or worsening symptoms.
